# Patient Record
Sex: Male | Born: 1980 | Race: Black or African American | Hispanic: No | Marital: Married | Smoking: Current every day smoker
Health system: Southern US, Community
[De-identification: ages and names within clinical notes are randomized; demographics above are authoritative.]

## PROBLEM LIST (undated history)

## (undated) DIAGNOSIS — E7800 Pure hypercholesterolemia, unspecified: Secondary | ICD-10-CM

## (undated) DIAGNOSIS — I10 Essential (primary) hypertension: Secondary | ICD-10-CM

## (undated) DIAGNOSIS — K859 Acute pancreatitis without necrosis or infection, unspecified: Secondary | ICD-10-CM

## (undated) DIAGNOSIS — K219 Gastro-esophageal reflux disease without esophagitis: Secondary | ICD-10-CM

## (undated) DIAGNOSIS — T7840XA Allergy, unspecified, initial encounter: Secondary | ICD-10-CM

## (undated) DIAGNOSIS — G4730 Sleep apnea, unspecified: Secondary | ICD-10-CM

## (undated) HISTORY — DX: Sleep apnea, unspecified: G47.30

## (undated) HISTORY — DX: Allergy, unspecified, initial encounter: T78.40XA

## (undated) HISTORY — DX: Gastro-esophageal reflux disease without esophagitis: K21.9

---

## 2008-01-14 ENCOUNTER — Emergency Department (HOSPITAL_COMMUNITY): Admission: EM | Admit: 2008-01-14 | Discharge: 2008-01-14 | Payer: Self-pay | Admitting: Emergency Medicine

## 2008-06-13 ENCOUNTER — Emergency Department (HOSPITAL_COMMUNITY): Admission: EM | Admit: 2008-06-13 | Discharge: 2008-06-13 | Payer: Self-pay | Admitting: Emergency Medicine

## 2009-01-27 ENCOUNTER — Emergency Department (HOSPITAL_COMMUNITY): Admission: EM | Admit: 2009-01-27 | Discharge: 2009-01-28 | Payer: Self-pay | Admitting: Emergency Medicine

## 2011-08-21 ENCOUNTER — Encounter (INDEPENDENT_AMBULATORY_CARE_PROVIDER_SITE_OTHER): Payer: Self-pay | Admitting: Ophthalmology

## 2011-08-29 ENCOUNTER — Encounter (INDEPENDENT_AMBULATORY_CARE_PROVIDER_SITE_OTHER): Payer: BC Managed Care – PPO | Admitting: Ophthalmology

## 2011-08-29 DIAGNOSIS — E11319 Type 2 diabetes mellitus with unspecified diabetic retinopathy without macular edema: Secondary | ICD-10-CM

## 2011-08-29 DIAGNOSIS — I10 Essential (primary) hypertension: Secondary | ICD-10-CM

## 2011-08-29 DIAGNOSIS — E1139 Type 2 diabetes mellitus with other diabetic ophthalmic complication: Secondary | ICD-10-CM

## 2011-08-29 DIAGNOSIS — H3581 Retinal edema: Secondary | ICD-10-CM

## 2011-10-08 ENCOUNTER — Encounter (INDEPENDENT_AMBULATORY_CARE_PROVIDER_SITE_OTHER): Payer: BC Managed Care – PPO | Admitting: Ophthalmology

## 2011-11-20 ENCOUNTER — Other Ambulatory Visit: Payer: Self-pay | Admitting: Family Medicine

## 2011-11-21 ENCOUNTER — Other Ambulatory Visit: Payer: Self-pay

## 2011-11-21 NOTE — Telephone Encounter (Signed)
Please pull chart and re-route to PA pool

## 2011-11-27 ENCOUNTER — Ambulatory Visit (INDEPENDENT_AMBULATORY_CARE_PROVIDER_SITE_OTHER): Payer: PRIVATE HEALTH INSURANCE | Admitting: Family Medicine

## 2011-11-27 VITALS — BP 147/88 | HR 92 | Temp 99.0°F | Resp 16 | Ht 67.0 in | Wt 227.0 lb

## 2011-11-27 DIAGNOSIS — E785 Hyperlipidemia, unspecified: Secondary | ICD-10-CM

## 2011-11-27 DIAGNOSIS — E119 Type 2 diabetes mellitus without complications: Secondary | ICD-10-CM

## 2011-11-27 DIAGNOSIS — I10 Essential (primary) hypertension: Secondary | ICD-10-CM

## 2011-11-27 LAB — POCT GLYCOSYLATED HEMOGLOBIN (HGB A1C): Hemoglobin A1C: 9.7

## 2011-11-27 MED ORDER — PRAVASTATIN SODIUM 40 MG PO TABS: 40.0000 mg | ORAL_TABLET | Freq: Every day | ORAL | Status: DC

## 2011-11-27 MED ORDER — METFORMIN HCL ER 500 MG PO TB24: 500.0000 mg | ORAL_TABLET | Freq: Every day | ORAL | Status: DC

## 2011-11-27 MED ORDER — LISINOPRIL 10 MG PO TABS: 10.0000 mg | ORAL_TABLET | Freq: Every day | ORAL | Status: DC

## 2011-11-27 NOTE — Progress Notes (Signed)
  Subjective:    Patient ID: Lee Maldonado, male    DOB: [birth year], 31 y.o.   MRN: [id number]  HPI  Patient presents to establish care for DM.  Patient was previously was cared for by Dr. Raquel James. Ran out of medications several months ago. Here for blood work and medication refills.  Last eye exam 1 month ago; then was referred to retinal specialist.  Patient was unable to afford laser  eye procedure. No lower extremity lesions.  Review of Systems     Objective:   Physical Exam  Constitutional: He appears well-developed and well-nourished.  HENT:  Head: Normocephalic and atraumatic.  Cardiovascular: Normal rate and regular rhythm.   Pulmonary/Chest: Effort normal and breath sounds normal.  Skin:       Early neuropathic changes     Results for orders placed in visit on 11/27/11  POCT GLYCOSYLATED HEMOGLOBIN (HGB A1C)      Component Value Range   Hemoglobin A1C 9.7          Assessment & Plan:   1. DM (diabetes mellitus)  Comprehensive metabolic panel, Lipid panel, Microalbumin, urine, POCT glycosylated hemoglobin (Hb A1C)  2. Diabetic retinopathy      See medications on AVS Extensive diabetic counseling Patient to follow up with me in 6 weeks

## 2011-11-28 LAB — COMPREHENSIVE METABOLIC PANEL
Albumin: 5.4 g/dL — ABNORMAL HIGH (ref 3.5–5.2)
CO2: 25 mEq/L (ref 19–32)
Glucose, Bld: 223 mg/dL — ABNORMAL HIGH (ref 70–99)
Potassium: 4.4 mEq/L (ref 3.5–5.3)
Sodium: 139 mEq/L (ref 135–145)
Total Protein: 9 g/dL — ABNORMAL HIGH (ref 6.0–8.3)

## 2011-11-28 LAB — LIPID PANEL
Cholesterol: 196 mg/dL (ref 0–200)
LDL Cholesterol: 114 mg/dL — ABNORMAL HIGH (ref 0–99)
Triglycerides: 215 mg/dL — ABNORMAL HIGH (ref <150)

## 2011-12-02 NOTE — Progress Notes (Signed)
Quick Note:  Please call patient and review his abnormal labs with him. We started him back at his OV on all his diabetic medications. I'd like to see him back in 6 weeks. Thanks ______

## 2012-03-03 ENCOUNTER — Emergency Department (HOSPITAL_COMMUNITY): Payer: PRIVATE HEALTH INSURANCE

## 2012-03-03 ENCOUNTER — Encounter (HOSPITAL_COMMUNITY): Payer: Self-pay | Admitting: Emergency Medicine

## 2012-03-03 ENCOUNTER — Inpatient Hospital Stay (HOSPITAL_COMMUNITY)
Admission: EM | Admit: 2012-03-03 | Discharge: 2012-03-08 | DRG: 438 | Disposition: A | Discharge status: Home / Self Care | Payer: PRIVATE HEALTH INSURANCE | Attending: Family Medicine | Admitting: Family Medicine

## 2012-03-03 DIAGNOSIS — E7800 Pure hypercholesterolemia, unspecified: Secondary | ICD-10-CM | POA: Insufficient documentation

## 2012-03-03 DIAGNOSIS — J690 Pneumonitis due to inhalation of food and vomit: Secondary | ICD-10-CM | POA: Diagnosis present

## 2012-03-03 DIAGNOSIS — E785 Hyperlipidemia, unspecified: Secondary | ICD-10-CM | POA: Diagnosis present

## 2012-03-03 DIAGNOSIS — I10 Essential (primary) hypertension: Secondary | ICD-10-CM | POA: Diagnosis present

## 2012-03-03 DIAGNOSIS — K859 Acute pancreatitis without necrosis or infection, unspecified: Secondary | ICD-10-CM

## 2012-03-03 DIAGNOSIS — IMO0001 Reserved for inherently not codable concepts without codable children: Secondary | ICD-10-CM | POA: Diagnosis present

## 2012-03-03 DIAGNOSIS — F1020 Alcohol dependence, uncomplicated: Secondary | ICD-10-CM | POA: Diagnosis present

## 2012-03-03 DIAGNOSIS — F17200 Nicotine dependence, unspecified, uncomplicated: Secondary | ICD-10-CM | POA: Diagnosis present

## 2012-03-03 HISTORY — DX: Essential (primary) hypertension: I10

## 2012-03-03 HISTORY — DX: Pure hypercholesterolemia, unspecified: E78.00

## 2012-03-03 LAB — POCT I-STAT TROPONIN I: Troponin i, poc: 0 ng/mL (ref 0.00–0.08)

## 2012-03-03 NOTE — ED Notes (Signed)
Patient complaining of mid-sternal chest pain (with radiation to left side) that started around 1130 this morning.  Patient reports nausea and vomiting; one episode of vomiting within the last 24 hours. Patient denies cardiac history.  EKG done in triage.

## 2012-03-04 ENCOUNTER — Encounter (HOSPITAL_COMMUNITY): Payer: Self-pay | Admitting: Radiology

## 2012-03-04 ENCOUNTER — Inpatient Hospital Stay (HOSPITAL_COMMUNITY): Payer: PRIVATE HEALTH INSURANCE

## 2012-03-04 ENCOUNTER — Emergency Department (HOSPITAL_COMMUNITY): Payer: PRIVATE HEALTH INSURANCE

## 2012-03-04 LAB — CBC
HCT: 41 % (ref 39.0–52.0)
Hemoglobin: 13.6 g/dL (ref 13.0–17.0)
MCH: 30 pg (ref 26.0–34.0)
MCHC: 35.7 g/dL (ref 30.0–36.0)
MCV: 86.7 fL (ref 78.0–100.0)
MCV: 83.9 fL (ref 78.0–100.0)
Platelets: 359 10*3/uL (ref 150–400)
RBC: 4.73 MIL/uL (ref 4.22–5.81)
WBC: 16.5 10*3/uL — ABNORMAL HIGH (ref 4.0–10.5)

## 2012-03-04 LAB — BASIC METABOLIC PANEL
BUN: 5 mg/dL — ABNORMAL LOW (ref 6–23)
CO2: 26 mEq/L (ref 19–32)
GFR calc non Af Amer: >90 mL/min (ref >90)
Glucose, Bld: 206 mg/dL — ABNORMAL HIGH (ref 70–99)
Potassium: 3.9 mEq/L (ref 3.5–5.1)

## 2012-03-04 LAB — COMPREHENSIVE METABOLIC PANEL
Alkaline Phosphatase: 65 U/L (ref 39–117)
BUN: 10 mg/dL (ref 6–23)
CO2: 23 mEq/L (ref 19–32)
Chloride: 95 mEq/L — ABNORMAL LOW (ref 96–112)
Creatinine, Ser: 0.59 mg/dL (ref 0.50–1.35)
GFR calc Af Amer: >90 mL/min (ref >90)
GFR calc non Af Amer: >90 mL/min (ref >90)
Glucose, Bld: 272 mg/dL — ABNORMAL HIGH (ref 70–99)
Total Bilirubin: 0.3 mg/dL (ref 0.3–1.2)

## 2012-03-04 LAB — URINALYSIS, ROUTINE W REFLEX MICROSCOPIC
Glucose, UA: 500 mg/dL — AB
Leukocytes, UA: NEGATIVE
Nitrite: NEGATIVE
pH: 6 (ref 5.0–8.0)

## 2012-03-04 LAB — POCT I-STAT, CHEM 8
BUN: 9 mg/dL (ref 6–23)
Calcium, Ion: 1.15 mmol/L (ref 1.12–1.32)
Chloride: 98 mEq/L (ref 96–112)
Glucose, Bld: 264 mg/dL — ABNORMAL HIGH (ref 70–99)

## 2012-03-04 LAB — LIPID PANEL
Cholesterol: 132 mg/dL (ref 0–200)
HDL: 39 mg/dL — ABNORMAL LOW (ref >39)
Total CHOL/HDL Ratio: 3.4 RATIO
VLDL: 39 mg/dL (ref 0–40)

## 2012-03-04 LAB — LIPASE, BLOOD
Lipase: 769 U/L — ABNORMAL HIGH (ref 11–59)
Lipase: 238 U/L — ABNORMAL HIGH (ref 11–59)

## 2012-03-04 LAB — DIFFERENTIAL
Eosinophils Absolute: 0 10*3/uL (ref 0.0–0.7)
Eosinophils Relative: 0 % (ref 0–5)
Lymphocytes Relative: 10 % — ABNORMAL LOW (ref 12–46)
Lymphs Abs: 1.7 10*3/uL (ref 0.7–4.0)
Metamyelocytes Relative: 0 %
Monocytes Absolute: 0.8 10*3/uL (ref 0.1–1.0)
Monocytes Relative: 5 % (ref 3–12)
Neutro Abs: 14 10*3/uL — ABNORMAL HIGH (ref 1.7–7.7)
nRBC: 0 /100 WBC

## 2012-03-04 LAB — CREATININE, SERUM: Creatinine, Ser: 0.59 mg/dL (ref 0.50–1.35)

## 2012-03-04 MED ORDER — MORPHINE SULFATE 2 MG/ML IJ SOLN
INTRAMUSCULAR | Status: AC
  Filled 2012-03-04: qty 1

## 2012-03-04 MED ORDER — HEPARIN SODIUM (PORCINE) 5000 UNIT/ML IJ SOLN
5000.0000 [IU] | Freq: Three times a day (TID) | INTRAMUSCULAR | Status: DC
  Administered 2012-03-04 – 2012-03-08 (×13): 5000 [IU] via SUBCUTANEOUS
  Filled 2012-03-04 (×16): qty 1

## 2012-03-04 MED ORDER — ONDANSETRON HCL 4 MG PO TABS: 4.0000 mg | ORAL_TABLET | Freq: Four times a day (QID) | ORAL | Status: DC | PRN

## 2012-03-04 MED ORDER — HYDROMORPHONE HCL PF 1 MG/ML IJ SOLN
1.0000 mg | Freq: Once | INTRAMUSCULAR | Status: AC
  Administered 2012-03-04: 1 mg via INTRAVENOUS
  Filled 2012-03-04: qty 1

## 2012-03-04 MED ORDER — LORAZEPAM 2 MG/ML IJ SOLN: 1.0000 mg | Freq: Four times a day (QID) | INTRAMUSCULAR | Status: AC | PRN

## 2012-03-04 MED ORDER — OXYCODONE HCL 5 MG PO TABS
5.0000 mg | ORAL_TABLET | ORAL | Status: DC | PRN
  Administered 2012-03-04 – 2012-03-05 (×3): 5 mg via ORAL
  Filled 2012-03-04 (×5): qty 1

## 2012-03-04 MED ORDER — ONDANSETRON 4 MG PO TBDP: 4.0000 mg | ORAL_TABLET | Freq: Three times a day (TID) | ORAL | Status: AC | PRN

## 2012-03-04 MED ORDER — PNEUMOCOCCAL VAC POLYVALENT 25 MCG/0.5ML IJ INJ
0.5000 mL | INJECTION | INTRAMUSCULAR | Status: AC
  Administered 2012-03-05: 0.5 mL via INTRAMUSCULAR
  Filled 2012-03-04: qty 0.5

## 2012-03-04 MED ORDER — THIAMINE HCL 100 MG/ML IJ SOLN
100.0000 mg | Freq: Every day | INTRAMUSCULAR | Status: DC
  Filled 2012-03-04 (×5): qty 1

## 2012-03-04 MED ORDER — DIPHENHYDRAMINE HCL 50 MG/ML IJ SOLN: 12.5000 mg | Freq: Four times a day (QID) | INTRAMUSCULAR | Status: DC | PRN

## 2012-03-04 MED ORDER — ONDANSETRON HCL 4 MG/2ML IJ SOLN: 4.0000 mg | Freq: Three times a day (TID) | INTRAMUSCULAR | Status: AC | PRN

## 2012-03-04 MED ORDER — ADULT MULTIVITAMIN W/MINERALS CH
1.0000 | ORAL_TABLET | Freq: Every day | ORAL | Status: DC
  Administered 2012-03-04 – 2012-03-08 (×5): 1 via ORAL
  Filled 2012-03-04 (×5): qty 1

## 2012-03-04 MED ORDER — MORPHINE SULFATE 2 MG/ML IJ SOLN
2.0000 mg | INTRAMUSCULAR | Status: DC | PRN
  Administered 2012-03-04 – 2012-03-05 (×6): 2 mg via INTRAVENOUS
  Filled 2012-03-04 (×5): qty 1

## 2012-03-04 MED ORDER — LISINOPRIL 10 MG PO TABS
10.0000 mg | ORAL_TABLET | Freq: Every day | ORAL | Status: DC
  Administered 2012-03-04 – 2012-03-08 (×5): 10 mg via ORAL
  Filled 2012-03-04 (×5): qty 1

## 2012-03-04 MED ORDER — PROMETHAZINE HCL 25 MG PO TABS: 25.0000 mg | ORAL_TABLET | Freq: Four times a day (QID) | ORAL | Status: DC | PRN

## 2012-03-04 MED ORDER — ONDANSETRON HCL 4 MG/2ML IJ SOLN
4.0000 mg | Freq: Once | INTRAMUSCULAR | Status: AC
  Administered 2012-03-04: 4 mg via INTRAVENOUS
  Filled 2012-03-04: qty 2

## 2012-03-04 MED ORDER — HYDROMORPHONE HCL PF 1 MG/ML IJ SOLN: 1.0000 mg | INTRAMUSCULAR | Status: AC | PRN

## 2012-03-04 MED ORDER — VITAMIN B-1 100 MG PO TABS
100.0000 mg | ORAL_TABLET | Freq: Every day | ORAL | Status: DC
  Administered 2012-03-04 – 2012-03-08 (×5): 100 mg via ORAL
  Filled 2012-03-04 (×5): qty 1

## 2012-03-04 MED ORDER — OXYCODONE-ACETAMINOPHEN 5-325 MG PO TABS
2.0000 | ORAL_TABLET | Freq: Once | ORAL | Status: AC
  Administered 2012-03-04: 2 via ORAL
  Filled 2012-03-04: qty 2

## 2012-03-04 MED ORDER — FOLIC ACID 1 MG PO TABS
1.0000 mg | ORAL_TABLET | Freq: Every day | ORAL | Status: DC
  Administered 2012-03-04 – 2012-03-08 (×5): 1 mg via ORAL
  Filled 2012-03-04 (×6): qty 1

## 2012-03-04 MED ORDER — CHLORHEXIDINE GLUCONATE 0.12 % MT SOLN
15.0000 mL | Freq: Two times a day (BID) | OROMUCOSAL | Status: DC
  Administered 2012-03-04 – 2012-03-06 (×5): 15 mL via OROMUCOSAL
  Filled 2012-03-04 (×6): qty 15

## 2012-03-04 MED ORDER — SODIUM CHLORIDE 0.9 % IV SOLN: INTRAVENOUS | Status: AC

## 2012-03-04 MED ORDER — BIOTENE DRY MOUTH MT LIQD
15.0000 mL | Freq: Two times a day (BID) | OROMUCOSAL | Status: DC
  Administered 2012-03-05 – 2012-03-06 (×4): 15 mL via OROMUCOSAL

## 2012-03-04 MED ORDER — IOHEXOL 300 MG/ML  SOLN
100.0000 mL | Freq: Once | INTRAMUSCULAR | Status: AC | PRN
  Administered 2012-03-04: 100 mL via INTRAVENOUS

## 2012-03-04 MED ORDER — SODIUM CHLORIDE 0.9 % IV BOLUS (SEPSIS)
1000.0000 mL | Freq: Once | INTRAVENOUS | Status: AC
  Administered 2012-03-04: 1000 mL via INTRAVENOUS

## 2012-03-04 MED ORDER — ACETAMINOPHEN 325 MG PO TABS
650.0000 mg | ORAL_TABLET | Freq: Four times a day (QID) | ORAL | Status: DC | PRN
  Administered 2012-03-04 – 2012-03-07 (×3): 650 mg via ORAL
  Filled 2012-03-04 (×3): qty 2

## 2012-03-04 MED ORDER — IBUPROFEN 600 MG PO TABS
600.0000 mg | ORAL_TABLET | Freq: Four times a day (QID) | ORAL | Status: DC | PRN
  Administered 2012-03-04 – 2012-03-06 (×4): 600 mg via ORAL
  Filled 2012-03-04 (×4): qty 1

## 2012-03-04 MED ORDER — ONDANSETRON HCL 4 MG/2ML IJ SOLN: 4.0000 mg | Freq: Four times a day (QID) | INTRAMUSCULAR | Status: DC | PRN

## 2012-03-04 MED ORDER — METFORMIN HCL ER 500 MG PO TB24
500.0000 mg | ORAL_TABLET | Freq: Every day | ORAL | Status: DC
  Administered 2012-03-06: 500 mg via ORAL
  Filled 2012-03-04 (×6): qty 1

## 2012-03-04 MED ORDER — SODIUM CHLORIDE 0.9 % IV SOLN
INTRAVENOUS | Status: AC
  Administered 2012-03-04 – 2012-03-05 (×6): via INTRAVENOUS

## 2012-03-04 MED ORDER — OXYCODONE-ACETAMINOPHEN 5-325 MG PO TABS: 1.0000 | ORAL_TABLET | ORAL | Status: AC | PRN

## 2012-03-04 MED ORDER — SIMVASTATIN 40 MG PO TABS
40.0000 mg | ORAL_TABLET | Freq: Every day | ORAL | Status: DC
  Administered 2012-03-04 – 2012-03-07 (×4): 40 mg via ORAL
  Filled 2012-03-04 (×5): qty 1

## 2012-03-04 MED ORDER — LORAZEPAM 1 MG PO TABS: 1.0000 mg | ORAL_TABLET | Freq: Four times a day (QID) | ORAL | Status: AC | PRN

## 2012-03-04 NOTE — Progress Notes (Signed)
Called by RN as patient febrile to 101.4  S: Patient does not report any worsening of abdominal pain.  No cough, SOB, dysuria.  O:  BP 122/66  Pulse 111  Temp(Src) 101.4 F (38.6 C) (Oral)  Resp 32  Ht 5\' 8"  (1.727 m)  Wt 227 lb 6.4 oz (103.148 kg)  BMI 34.58 kg/m2  SpO2 100% Gen: covered in blankets; mildly diaphoretic Pulm: CTAB, no rales or wheezes Abd: +BS, mild epigastric pain  A/P - will check repeat CXR - will check UA - give tylenol 650mg  - will hold off on starting antibiotics given good clinical picture  BOOTH, Angelle Isais 03/04/2012, 6:05 PM

## 2012-03-04 NOTE — ED Notes (Signed)
Nurse on 5100 unable to take report.  Number given and she is to call back by 615am

## 2012-03-04 NOTE — H&P (Signed)
Family Medicine Teaching [facility] Admission History and Physical  Patient name: Lee Maldonado Medical record number: [id number] Date of birth: [birth year] Age: 31 y.o. Gender: male  Primary Care Provider: Elvina Sidle, MD, MD  Chief Complaint: abdominal pain  History of Present Illness: Lee Maldonado is a 31 y.o. year old male presenting with acute onset abdominal pain today at 11:30am that is constant and sharp. No alleviating or agrevating factors. Involves the whole belly w/ radiation to the back and epigastric region being the worst. Emesis x1 that was non bloody. Has never had anything like this happen before. Pt still has his gallbladder. Pt takes his cholesterol medications. Pts last Etoh in the early morning hours of Sunday.   Pt reports CBG at home range from 100s-200s.   Review Of Systems: Per HPI with the following additions:  Denies: CP, SOB, lightheadedness, syncope, hematochezia, melena, hematuria, hematemesis, wt loss, vision change.   Patient Active Problem List  Diagnoses  . DM (diabetes mellitus)  . HTN  . Hypercholesteremia   Past Medical History: Past Medical History  Diagnosis Date  . Diabetes mellitus   . Hypertension   . Hypercholesteremia     Past Surgical History: History reviewed. No pertinent past surgical history.  Social History: History   Social History  . Marital Status: Single    Spouse Name: N/A    Number of Children: N/A  . Years of Education: N/A   Social History Main Topics  . Smoking status: Current Everyday Smoker -- 0.2 packs/day  . Smokeless tobacco: None  . Alcohol Use: No  . Drug Use: No  . Sexually Active: None   Other Topics Concern  . None   Social History Narrative  . None    Family History: History reviewed. No pertinent family history.  Allergies: No Known Allergies  Current Facility-Administered Medications  Medication Dose Route Frequency Provider Last Rate Last Dose  . HYDROmorphone (DILAUDID)  injection 1 mg  1 mg Intravenous Once Vida Roller, MD   1 mg at 03/04/12 0130  . HYDROmorphone (DILAUDID) injection 1 mg  1 mg Intravenous Once Vida Roller, MD   1 mg at 03/04/12 0158  . iohexol (OMNIPAQUE) 300 MG/ML solution 100 mL  100 mL Intravenous Once PRN Medication Radiologist, MD   100 mL at 03/04/12 0335  . ondansetron (ZOFRAN) injection 4 mg  4 mg Intravenous Once Vida Roller, MD   4 mg at 03/04/12 0128  . oxyCODONE-acetaminophen (PERCOCET) 5-325 MG per tablet 2 tablet  2 tablet Oral Once Vida Roller, MD   2 tablet at 03/04/12 0439  . sodium chloride 0.9 % bolus 1,000 mL  1,000 mL Intravenous Once Vida Roller, MD   1,000 mL at 03/04/12 0128  . sodium chloride 0.9 % bolus 1,000 mL  1,000 mL Intravenous Once Vida Roller, MD   1,000 mL at 03/04/12 0219   Current Outpatient Prescriptions  Medication Sig Dispense Refill  . lisinopril (PRINIVIL,ZESTRIL) 10 MG tablet Take 10 mg by mouth daily.      . metFORMIN (GLUCOPHAGE-XR) 500 MG 24 hr tablet Take 500 mg by mouth daily with breakfast.      . pravastatin (PRAVACHOL) 40 MG tablet Take 40 mg by mouth daily.      . ondansetron (ZOFRAN ODT) 4 MG disintegrating tablet Take 1 tablet (4 mg total) by mouth every 8 (eight) hours as needed for nausea.  10 tablet  0  . oxyCODONE-acetaminophen (PERCOCET) 5-325 MG per tablet  Take 1 tablet by mouth every 4 (four) hours as needed for pain. May take 2 tablets PO q 6 hours for severe pain - Do not take with Tylenol as this tablet already contains tylenol  30 tablet  0  . promethazine (PHENERGAN) 25 MG tablet Take 1 tablet (25 mg total) by mouth every 6 (six) hours as needed for nausea.  12 tablet  0     Physical Exam: Filed Vitals:   03/04/12 0300  BP: 141/86  Pulse: 98  Temp:   Resp: 20   General: alert, cooperative and mild distress HEENT: extra ocular movement intact Heart: S1, S2 normal, no murmur, rub or gallop, regular rate and rhythm Lungs: clear to auscultation, no wheezes  or rales and unlabored breathing on 2L [place] Abdomen: Mild diffuse abdominal pain. Worse in epigastric region. Hypoactive bowel sounds Extremities: 2+ distal pulses, no edema Musculoskeletal: normal ROM Skin:no rashes, no ecchymoses Neurology: normal without focal findings and mental status, speech normal, alert and oriented x3  Labs and Imaging: Lab Results  Component Value Date/Time   NA 136 03/04/2012  4:27 AM   K 4.7 03/04/2012  4:27 AM   CL 98 03/04/2012  4:27 AM   CO2 23 03/03/2012 11:40 PM   BUN 9 03/04/2012  4:27 AM   CREATININE 0.70 03/04/2012  4:27 AM   CREATININE 0.89 11/27/2011  7:47 PM   GLUCOSE 264* 03/04/2012  4:27 AM   Lab Results  Component Value Date   WBC 16.5* 03/03/2012   HGB 14.3 03/04/2012   HCT 42.0 03/04/2012   MCV 86.7 03/03/2012   PLT 359 03/03/2012   Lipase     Component Value Date/Time   LIPASE 769* 03/04/2012 0105    Dg Chest 2 View  03/03/2012  *RADIOLOGY REPORT*  Clinical Data: Chest pain.  History of hypertension.  CHEST - 2 VIEW  Comparison: No priors.  Findings:   Lung volumes are normal.  No consolidative airspace disease.  No pleural effusions.  The mild diffuse interstitial prominence and peribronchial cuffing is noted.  Pulmonary vasculature is normal.  Heart size is normal.  Mediastinal contours are unremarkable.  IMPRESSION: 1.  Findings, as above, suggestive of mild acute bronchitis.  Original Report Authenticated By: Florencia Reasons, M.D.    CT ABDOMEN AND PELVIS WITH CONTRAST    1.  CT findings consistent with acute pancreatitis involving the tail region of the pancreas without complicating features such as hemorrhage or necrosis.  Moderate fluid in the anterior pararenal space on the left. 2.  Diffuse fatty infiltration of the liver. 3.  Normal CT appearance of the gallbladder and normal caliber common bile duct.  Original Report Authenticated By: P. Loralie Champagne, M.D.   Troponin (Point of Care Test)  [facility] 03/03/12 2240  TROPIPOC 0.00   CXR: 1.  Findings, as above, suggestive of mild acute bronchitis.  Assessment and Plan: Lee Maldonado is a 31 y.o. year old male with h/o HLD, DM, HTN, presenting w/ acute onset abdominal pain w/ laboratory and imaging confirmed pancreatitis.  1. Pancreatitis: Etiology likely Etoh induced vs hypertriglyceridemia. Last alcoholic drink less than 36 hrs prior to onset of symptoms. Last lipid panel in February. Lipase 769, w/ CT imaging significant for pancreatitis in the tail of the pancreas. No sign of biliary stone involvement - Admit to inpt - NPO - IVF NS 133ml/hr - IV morphine 2mg  Q3 PRN - Oxycodone 5mg  Q4 - BMET and Lipase at 1700 - repeat Lipid panel at  1700 - Benadryl for itching - Zofran for nausea  2. DM: Last A1c >9. CBG on admission 264. Only taking Metformin. Will likely need insulin. Will wait to start until PO returns.  - Repeat A1c  3. FEN/GI: Nauseaus w/ emesis x1 at home. HyperK on first labs likely from hemolysis as repeat labs normal. - NPO and IVF as above  4.  Prophylaxis:  - Protonix - Heparin SQ TID  5. Disposition: Pending clinical improvement    Signed: MERRELL, DAVID, M.D. Family Medicine Resident PGY-1 [phone] 03/04/2012 5:57 AM   R3 Addendum Edd Arbour, MD  Patient seen and examined by me independently. I have reviewed the above note and agree with the A/P. Independent PE below:  Filed Vitals:   03/04/12 0500 03/04/12 0600 03/04/12 0602 03/04/12 0648  BP: 132/79 155/91  142/80  Pulse: 112 101  103  Temp:   99.1 F (37.3 C) 98.7 F (37.1 C)  TempSrc:   Oral Oral  Resp:    21  Height:    5\' 8"  (1.727 m)  Weight:    227 lb 6.4 oz (103.148 kg)  SpO2: 98% 96%  96%   General: aam, nad, sleeping, alert to voice. aox3 Lungs:  Normal respiratory effort, chest expands symmetrically. Lungs are clear to auscultation, no crackles or wheezes. Heart - Regular rate and rhythm.  No murmurs, gallops or rubs.    Abd: diffusely tender to palpation,  distended but soft, no rebound or rigidity.  Extremities:   Non-tender, No cyanosis, edema, or deformity noted. Skin:  Intact without suspicious lesions or rashes  Edd Arbour, MD

## 2012-03-04 NOTE — H&P (Signed)
I have seen and examined this patient. I have discussed with Dr(s) Konrad Dolores and Rivka Safer .  I agree with their findings and plans as documented in their admission notes.  Acute Issues 1. Acute Alcohol-induced pancreatitis. - Mild to moderate grade. - Adequate pain control with opiates.  - IVF at maintenance.  - Advance diet as tolerated. - Counseling about alcohol abuse and dependence.

## 2012-03-04 NOTE — Progress Notes (Signed)
Inpatient Diabetes Program Recommendations  AACE/ADA: New Consensus Statement on Inpatient Glycemic Control  Target Ranges:  Prepandial:   less than 140 mg/dL      Peak postprandial:   less than 180 mg/dL (1-2 hours)      Critically ill patients:  140 - 180 mg/dL  Pager:  [phone] Hours:  8 am-10pm   Reason for Visit: Elevated glucose: 264 mg/dL  Inpatient Diabetes Program Recommendations Correction (SSI): Add Novolog Correction HgbA1C: Consider adjusting home medications due to elevated HGBA1C Outpatient Referral: Please order outpatient diabetes education  Alfredia Client PhD, RN Diabetes Coordinator  Office:  [phone] Team Pager:  [phone]

## 2012-03-04 NOTE — ED Notes (Signed)
CT called contrast completed.

## 2012-03-04 NOTE — ED Notes (Signed)
Started drinking contrast.

## 2012-03-04 NOTE — ED Provider Notes (Signed)
History     CSN: [id number]  Arrival date & time 03/03/12  2202   First MD Initiated Contact with Patient 03/04/12 0046      Chief Complaint  Patient presents with  . Chest Pain    (Consider location/radiation/quality/duration/timing/severity/associated sxs/prior treatment) HPI Comments: 31 year old male with a history of hypertension and diabetes who presents with a complaint of left-sided abdominal pain. According to the patient this started approximately 11:30 in the morning, has been persistent, gradually worsening and now radiating up into the epigastrium. He states this is a sharp and stabbing pain, associated with one episode of nausea and vomiting but no changes in bowel habits including diarrhea constipation blood in the stools or difficulty urinating. He has had hardly anything deeper drink today and has a hard time making any urine. He denies swelling, rash, headache, sore throat, blurred vision, fevers, chills, coughing. He has had no medication prior to arrival. Symptoms are persistent, moderate to severe at this time  Patient is a 31 y.o. male presenting with chest pain. The history is provided by the patient.  Chest Pain     Past Medical History  Diagnosis Date  . Diabetes mellitus   . Hypertension   . Hypercholesteremia     History reviewed. No pertinent past surgical history.  History reviewed. No pertinent family history.  History  Substance Use Topics  . Smoking status: Current Everyday Smoker -- 0.2 packs/day  . Smokeless tobacco: Not on file  . Alcohol Use: No      Review of Systems  Cardiovascular: Positive for chest pain.  All other systems reviewed and are negative.    Allergies  Review of patient's allergies indicates no known allergies.  Home Medications   Current Outpatient Rx  Name Route Sig Dispense Refill  . LISINOPRIL 10 MG PO TABS Oral Take 10 mg by mouth daily.    Marland Kitchen METFORMIN HCL ER 500 MG PO TB24 Oral Take 500 mg by mouth daily  with breakfast.    . PRAVASTATIN SODIUM 40 MG PO TABS Oral Take 40 mg by mouth daily.    Marland Kitchen ONDANSETRON 4 MG PO TBDP Oral Take 1 tablet (4 mg total) by mouth every 8 (eight) hours as needed for nausea. 10 tablet 0  . OXYCODONE-ACETAMINOPHEN 5-325 MG PO TABS Oral Take 1 tablet by mouth every 4 (four) hours as needed for pain. May take 2 tablets PO q 6 hours for severe pain - Do not take with Tylenol as this tablet already contains tylenol 30 tablet 0  . PROMETHAZINE HCL 25 MG PO TABS Oral Take 1 tablet (25 mg total) by mouth every 6 (six) hours as needed for nausea. 12 tablet 0    BP 141/86  Pulse 98  Temp(Src) 99 F (37.2 C) (Oral)  Resp 20  SpO2 97%  Physical Exam  Nursing note and vitals reviewed. Constitutional: He appears well-developed and well-nourished.       Uncomfortable appearing  HENT:  Head: Normocephalic and atraumatic.  Mouth/Throat: Oropharynx is clear and moist. No oropharyngeal exudate.  Eyes: Conjunctivae and EOM are normal. Pupils are equal, round, and reactive to light. Right eye exhibits no discharge. Left eye exhibits no discharge. No scleral icterus.  Neck: Normal range of motion. Neck supple. No JVD present. No thyromegaly present.  Cardiovascular: Normal rate, regular rhythm, normal heart sounds and intact distal pulses.  Exam reveals no gallop and no friction rub.   No murmur heard. Pulmonary/Chest: Effort normal and breath sounds normal. No  respiratory distress. He has no wheezes. He has no rales.  Abdominal: Soft. Bowel sounds are normal. He exhibits no distension and no mass. There is tenderness ( Focal left lower quadrant tenderness with guarding, non-peritoneal, no pain in the right lower quadrant, minimal pain in the right upper quadrant and epigastrium.). There is guarding. There is no rebound.  Musculoskeletal: Normal range of motion. He exhibits no edema and no tenderness.  Lymphadenopathy:    He has no cervical adenopathy.  Neurological: He is alert.  Coordination normal.  Skin: Skin is warm and dry. No rash noted. No erythema.  Psychiatric: He has a normal mood and affect. His behavior is normal.    ED Course  Procedures (including critical care time)  ED ECG REPORT   Date: 03/04/2012   Rate: 101  Rhythm: sinus tachycardia  QRS Axis: normal  Intervals: normal  ST/T Wave abnormalities: normal  Conduction Disutrbances:none  Narrative Interpretation:   Old EKG Reviewed: none available   Labs Reviewed  CBC - Abnormal; Notable for the following:    WBC 16.5 (*)    RDW 17.0 (*)    All other components within normal limits  DIFFERENTIAL - Abnormal; Notable for the following:    Neutrophils Relative 84 (*)    Lymphocytes Relative 10 (*)    Neutro Abs 14.0 (*)    All other components within normal limits  COMPREHENSIVE METABOLIC PANEL - Abnormal; Notable for the following:    Sodium 133 (*) POST-ULTRACENTRIFUGATION   Potassium 6.1 (*) HEMOLYSIS AT THIS LEVEL MAY AFFECT RESULT   Chloride 95 (*)    Glucose, Bld 272 (*)    Total Protein 8.7 (*)    AST 47 (*) HEMOLYSIS AT THIS LEVEL MAY AFFECT RESULT   All other components within normal limits  LIPASE, BLOOD - Abnormal; Notable for the following:    Lipase 769 (*)    All other components within normal limits  POCT I-STAT TROPONIN I   Dg Chest 2 View  03/03/2012  *RADIOLOGY REPORT*  Clinical Data: Chest pain.  History of hypertension.  CHEST - 2 VIEW  Comparison: No priors.  Findings:   Lung volumes are normal.  No consolidative airspace disease.  No pleural effusions.  The mild diffuse interstitial prominence and peribronchial cuffing is noted.  Pulmonary vasculature is normal.  Heart size is normal.  Mediastinal contours are unremarkable.  IMPRESSION: 1.  Findings, as above, suggestive of mild acute bronchitis.  Original Report Authenticated By: Florencia Reasons, M.D.   Ct Abdomen Pelvis W Contrast  03/04/2012  *RADIOLOGY REPORT*  Clinical Data: Epigastric abdominal pain.  CT  ABDOMEN AND PELVIS WITH CONTRAST  Technique:  Multidetector CT imaging of the abdomen and pelvis was performed following the standard protocol during bolus administration of intravenous contrast.  Contrast: OMNIPAQUE IOHEXOL 300 MG/ML  SOLN  Comparison: None.  Findings: The lung bases demonstrate bibasilar atelectasis.  No pleural effusion.  There is diffuse fatty infiltration of the liver but no focal hepatic lesions or intrahepatic biliary dilatation to the gallbladder is normal.  No common bile duct dilatation.  The spleen is normal in size.  There is inflammation involving the tail region of the pancreas with surrounding inflammatory phlegmon.  Findings consistent with acute pancreatitis.  The head and body are not involved.  Normal caliber common bile duct and pancreatic duct.  No findings for pancreatic necrosis or hemorrhage.  The adrenal glands and kidneys are normal.  The stomach, duodenum, small bowel and colon  are unremarkable. A small bowel lipoma is noted.  Small scattered mesenteric and retroperitoneal lymph nodes but no adenopathy.  The aorta is normal in caliber.  The major branch vessels are normal.  The bladder is mildly distended.  The prostate gland and seminal vesicles are unremarkable.  No pelvic mass or adenopathy.  The bony structures are unremarkable.  IMPRESSION:  1.  CT findings consistent with acute pancreatitis involving the tail region of the pancreas without complicating features such as hemorrhage or necrosis.  Moderate fluid in the anterior pararenal space on the left. 2.  Diffuse fatty infiltration of the liver. 3.  Normal CT appearance of the gallbladder and normal caliber common bile duct.  Original Report Authenticated By: P. Loralie Champagne, M.D.     1. Acute pancreatitis       MDM  Lungs and heart are clear, pulse is 98 on my exam, normal blood pressure, afebrile, normal oxygen saturations. EKG shows normal sinus rhythm without any acute findings and there is no  old EKG to compare to.  Initial laboratory data suggests a white blood cell count of 16,500 with a leftward shift, hyperkalemia at 6.1, hyperglycemia at 272 and a mild elevation in AST of 47. Troponin is negative, lipase was added, CT scan to rule out diverticulitis, perforation.  Lipase elevated, LFT's normal, CT shows pancreatitis, no findings on LLQ.   Patient reexamined with minimally tender abdomen, feels better, pain has improved significantly and vital signs are normal including a pulse less than 100.  Patient reexamined and still has ongoing epigastric pain. He has been given several doses of pain medication and does not feel comfortable going home. His pulse is currently 95, oxygen levels are normal, has ongoing nausea and pain.   Discussed with family practice resident, will admit to the hospital.  Vida Roller, MD 03/04/12 [phone]

## 2012-03-04 NOTE — ED Notes (Signed)
Patient states he has been having pain around his abd to the lower back since about 1130 this AM. +vomiting X1 No cardiac history

## 2012-03-05 LAB — GLUCOSE, CAPILLARY
Glucose-Capillary: 202 mg/dL — ABNORMAL HIGH (ref 70–99)
Glucose-Capillary: 193 mg/dL — ABNORMAL HIGH (ref 70–99)

## 2012-03-05 LAB — HEMOGLOBIN A1C: Hgb A1c MFr Bld: 11.1 % — ABNORMAL HIGH (ref <5.7)

## 2012-03-05 MED ORDER — INSULIN ASPART 100 UNIT/ML ~~LOC~~ SOLN: 0.0000 [IU] | Freq: Every day | SUBCUTANEOUS | Status: DC

## 2012-03-05 MED ORDER — OXYCODONE-ACETAMINOPHEN 5-325 MG PO TABS
1.0000 | ORAL_TABLET | ORAL | Status: DC | PRN
  Administered 2012-03-05: 1 via ORAL
  Administered 2012-03-05 (×2): 2 via ORAL
  Administered 2012-03-05: 1 via ORAL
  Administered 2012-03-06 – 2012-03-08 (×9): 2 via ORAL
  Filled 2012-03-05 (×4): qty 2
  Filled 2012-03-05: qty 1
  Filled 2012-03-05 (×7): qty 2
  Filled 2012-03-05: qty 1

## 2012-03-05 MED ORDER — AZITHROMYCIN 500 MG PO TABS
500.0000 mg | ORAL_TABLET | Freq: Every day | ORAL | Status: DC
  Filled 2012-03-05: qty 1

## 2012-03-05 MED ORDER — AMOXICILLIN-POT CLAVULANATE 875-125 MG PO TABS
1.0000 | ORAL_TABLET | Freq: Two times a day (BID) | ORAL | Status: DC
  Administered 2012-03-05 – 2012-03-08 (×7): 1 via ORAL
  Filled 2012-03-05 (×8): qty 1

## 2012-03-05 MED ORDER — MORPHINE SULFATE 2 MG/ML IJ SOLN
2.0000 mg | Freq: Once | INTRAMUSCULAR | Status: AC
  Administered 2012-03-05: 2 mg via INTRAVENOUS

## 2012-03-05 MED ORDER — INSULIN ASPART 100 UNIT/ML ~~LOC~~ SOLN
0.0000 [IU] | Freq: Three times a day (TID) | SUBCUTANEOUS | Status: DC
  Administered 2012-03-05 – 2012-03-06 (×3): 3 [IU] via SUBCUTANEOUS
  Administered 2012-03-06 (×2): 2 [IU] via SUBCUTANEOUS
  Administered 2012-03-07: 3 [IU] via SUBCUTANEOUS
  Administered 2012-03-07 – 2012-03-08 (×3): 2 [IU] via SUBCUTANEOUS
  Administered 2012-03-08: 3 [IU] via SUBCUTANEOUS

## 2012-03-05 MED ORDER — MORPHINE SULFATE 2 MG/ML IJ SOLN
INTRAMUSCULAR | Status: AC
  Filled 2012-03-05: qty 1

## 2012-03-05 NOTE — Progress Notes (Signed)
I discussed with  Dr Merrell.  I agree with their plans documented in their progress note for today.  

## 2012-03-05 NOTE — Clinical Social Work Psychosocial (Signed)
     Clinical Social Work Department BRIEF PSYCHOSOCIAL ASSESSMENT 03/05/2012  Patient:  Lee Maldonado, Lee Maldonado     Account Number:  [id number]     Admit date:  03/03/2012  Clinical Social Worker:  Lourdes Sledge  Date/Time:  03/05/2012 01:33 PM  Referred by:  Physician  Date Referred:  03/05/2012 Referred for  Substance Abuse   Other Referral:   Interview type:  Patient Other interview type:    PSYCHOSOCIAL DATA Living Status:  FAMILY Admitted from facility:   Level of care:   Primary support name:  Abel Presto Primary support relationship to patient:  SPOUSE Degree of support available:   Pt did not disclose amount of support avilable.    CURRENT CONCERNS Current Concerns  Substance Abuse   Other Concerns:    SOCIAL WORK ASSESSMENT / PLAN CSW received refferal for substance abuse    CSW introcuded herself and role. CSW informed pt of referral. Pt stated he does not have any issues with alcohol and only drinks casually. Pt states he drinks "sometimes on weekends." Pt stated he has never had an alchol problem and is not interested in resources. Pt appreciated visit however felt it was inappropriate referral.   Assessment/plan status:   Other assessment/ plan:   Information/referral to community resources:   Pt declined any substance abuse resources. CSW did provide pt with her contact information if he did have additional cSW needs while in the hospital.    PATIENTS/FAMILYS RESPONSE TO PLAN OF CARE: Pt laying in bed getting ready to eat an [place] ice.Pt was alert and oriented and pleasant to speak to. Pt was open to answering CSW questions pertaining to substance abuse however stated he did not have an alcohol problem and was not interested in substance abuse resources.

## 2012-03-05 NOTE — Progress Notes (Signed)
Family Medicine Teaching Service [facility] Progress Note  Patient name: Lee Maldonado Medical record number: [id number] Date of birth: 03-23-1981 Age: 31 y.o. Gender: male    LOS: 2 days   Primary Care Provider: Elvina Sidle, MD, MD  Overnight Events:  Pain improved this am, but still feels poorly. Feels better on O2. Wants to ambulate. Tolerating PO. Some pain w/ initial swallowing of jello which only lasted minutes. No lasting pain and no recurrance of pain w/ meals   Objective: Vital signs in last 24 hours: Temp:  [99.5 F (37.5 C)-101.8 F (38.8 C)] 99.5 F (37.5 C) (06/05 0059) Pulse Rate:  [100-111] 109  (06/05 0545) Resp:  [18-32] 26  (06/05 0059) BP: (109-141)/(51-88) 118/68 mmHg (06/05 0545) SpO2:  [93 %-100 %] 96 % (06/05 0059)  Wt Readings from Last 3 Encounters:  03/04/12 227 lb 6.4 oz (103.148 kg)  11/27/11 227 lb (102.967 kg)     Current Facility-Administered Medications  Medication Dose Route Frequency Provider Last Rate Last Dose  . 0.9 %  sodium chloride infusion   Intravenous STAT Vida Roller, MD 150 mL/hr at 03/04/12 0700    . 0.9 %  sodium chloride infusion   Intravenous Continuous Ozella Rocks, MD 150 mL/hr at 03/05/12 0732    . acetaminophen (TYLENOL) tablet 650 mg  650 mg Oral Q6H PRN Phebe Colla, MD   650 mg at 03/04/12 1817  . antiseptic oral rinse (BIOTENE) solution 15 mL  15 mL Mouth Rinse q12n4p Todd D McDiarmid, MD      . azithromycin (ZITHROMAX) tablet 500 mg  500 mg Oral Daily Ozella Rocks, MD      . chlorhexidine (PERIDEX) 0.12 % solution 15 mL  15 mL Mouth Rinse BID Leighton Roach McDiarmid, MD   15 mL at 03/05/12 0746  . diphenhydrAMINE (BENADRYL) injection 12.5 mg  12.5 mg Intravenous Q6H PRN Ozella Rocks, MD      . folic acid (FOLVITE) tablet 1 mg  1 mg Oral Daily Phebe Colla, MD   1 mg at 03/04/12 1227  . heparin injection 5,000 Units  5,000 Units Subcutaneous Q8H Ozella Rocks, MD   5,000 Units at 03/05/12 0543  . HYDROmorphone  (DILAUDID) injection 1 mg  1 mg Intravenous Q4H PRN Vida Roller, MD      . ibuprofen (ADVIL,MOTRIN) tablet 600 mg  600 mg Oral Q6H PRN Ozella Rocks, MD   600 mg at 03/04/12 2245  . lisinopril (PRINIVIL,ZESTRIL) tablet 10 mg  10 mg Oral Daily Ozella Rocks, MD   10 mg at 03/04/12 1042  . LORazepam (ATIVAN) tablet 1 mg  1 mg Oral Q6H PRN Phebe Colla, MD       Or  . LORazepam (ATIVAN) injection 1 mg  1 mg Intravenous Q6H PRN Phebe Colla, MD      . metFORMIN (GLUCOPHAGE-XR) 24 hr tablet 500 mg  500 mg Oral Q breakfast Ozella Rocks, MD      . morphine 2 MG/ML injection 2 mg  2 mg Intravenous Q3H PRN Ozella Rocks, MD   2 mg at 03/05/12 0548  . morphine 2 MG/ML injection           . mulitivitamin with minerals tablet 1 tablet  1 tablet Oral Daily Phebe Colla, MD   1 tablet at 03/04/12 1228  . ondansetron (ZOFRAN) injection 4 mg  4 mg Intravenous Q8H PRN Vida Roller, MD      .  ondansetron (ZOFRAN) tablet 4 mg  4 mg Oral Q6H PRN Ozella Rocks, MD       Or  . ondansetron [facility]) injection 4 mg  4 mg Intravenous Q6H PRN Ozella Rocks, MD      . oxyCODONE (Oxy IR/ROXICODONE) immediate release tablet 5 mg  5 mg Oral Q4H PRN Ozella Rocks, MD   5 mg at 03/05/12 0746  . pneumococcal 23 valent vaccine (PNU-IMMUNE) injection 0.5 mL  0.5 mL Intramuscular Tomorrow-1000 Todd D McDiarmid, MD      . simvastatin (ZOCOR) tablet 40 mg  40 mg Oral q1800 Ozella Rocks, MD   40 mg at 03/04/12 1817  . thiamine (VITAMIN B-1) tablet 100 mg  100 mg Oral Daily Phebe Colla, MD   100 mg at 03/04/12 1227   Or  . thiamine (B-1) injection 100 mg  100 mg Intravenous Daily Phebe Colla, MD         PE: Gen: NAD HEENT: MMM CV: RRR, no m/r/g [id number] breath sounds on Rt. Poor air movement. Normal effort. On 2L [place]. No crackles Abd: soft non-tender Ext/Musc: 2+ pulses Neuro: CN intact  Labs/Studies: chest X-ray: Patchy right lower lobe airspace disease worrisome for  pneumonia.   Assessment/Plan: Lee Maldonado is a 31 y.o. year old male with h/o HLD, DM, HTN, presenting w/ acute onset abdominal pain w/ laboratory and imaging confirmed pancreatitis, now w/ Pneumonia  # Pancreatitis: Etiology likely Etoh induced vs hypertriglyceridemia. Last alcoholic drink less than 36 hrs prior to onset of symptoms. Last lipid panel in February. Lipase 769 on admission now 238 w/ CT imaging significant for pancreatitis in the tail of the pancreas. No sign of biliary stone involvement. Now tolerating clears.  - ADAT - IVF NS 176ml/hr (Will DC tonight at 1700 as po returns) - Will change pain regimen to Percocet only  - Benadryl for itching  - Zofran for nausea  - BMET in am  # Pneumonia: Pt w/ likely community acquired pneumonia. No h/o aspiration or emesis w/ drinking binge this weekend. Fever overnight w/ CXR changes - Augmentin 875 BID  # DM: A1c increased to 11.1 from previous of 9. Pt very poorly controlled. . CBG on admission 264 now 206. Only taking Metformin. Will likely need to increase metformin and start insulin.  -Will start SSI - cont Metformin   # FEN/GI: tolerating clears. - ADAT - IVF as above   # Prophylaxis:  - Protonix  - Heparin SQ TID    # Disposition: Pending clinical improvement  LOS 2  Signed: Shelly Flatten, MD Family Medicine Resident PGY-1 [phone] 03/05/2012 9:12 AM

## 2012-03-06 ENCOUNTER — Inpatient Hospital Stay (HOSPITAL_COMMUNITY): Payer: PRIVATE HEALTH INSURANCE

## 2012-03-06 LAB — GLUCOSE, CAPILLARY
Glucose-Capillary: 210 mg/dL — ABNORMAL HIGH (ref 70–99)
Glucose-Capillary: 187 mg/dL — ABNORMAL HIGH (ref 70–99)
Glucose-Capillary: 151 mg/dL — ABNORMAL HIGH (ref 70–99)

## 2012-03-06 LAB — BASIC METABOLIC PANEL
CO2: 26 mEq/L (ref 19–32)
Calcium: 8.8 mg/dL (ref 8.4–10.5)
Chloride: 97 mEq/L (ref 96–112)
Glucose, Bld: 174 mg/dL — ABNORMAL HIGH (ref 70–99)
Sodium: 132 mEq/L — ABNORMAL LOW (ref 135–145)

## 2012-03-06 MED ORDER — IOHEXOL 300 MG/ML  SOLN
20.0000 mL | INTRAMUSCULAR | Status: AC
  Administered 2012-03-06 (×2): 20 mL via ORAL

## 2012-03-06 MED ORDER — BD GETTING STARTED TAKE HOME KIT: 1/2ML X 30G SYRINGES
1.0000 | Freq: Once | Status: DC
  Filled 2012-03-06: qty 1

## 2012-03-06 MED ORDER — MORPHINE SULFATE 2 MG/ML IJ SOLN
2.0000 mg | Freq: Once | INTRAMUSCULAR | Status: AC
  Administered 2012-03-06: 2 mg via INTRAVENOUS
  Filled 2012-03-06: qty 1

## 2012-03-06 MED ORDER — LIVING WELL WITH DIABETES BOOK
Freq: Once | Status: DC
  Filled 2012-03-06: qty 1

## 2012-03-06 MED ORDER — MORPHINE SULFATE 4 MG/ML IJ SOLN
4.0000 mg | INTRAMUSCULAR | Status: DC | PRN
  Administered 2012-03-06 – 2012-03-07 (×4): 4 mg via INTRAVENOUS
  Filled 2012-03-06 (×5): qty 1

## 2012-03-06 MED ORDER — BD GETTING STARTED TAKE HOME KIT: 3/10ML X 30G SYRINGES
1.0000 | Freq: Once | Status: AC
  Administered 2012-03-06: 1
  Filled 2012-03-06: qty 1

## 2012-03-06 MED ORDER — MORPHINE SULFATE 2 MG/ML IJ SOLN
2.0000 mg | INTRAMUSCULAR | Status: DC | PRN
  Administered 2012-03-06 (×2): 2 mg via INTRAVENOUS
  Filled 2012-03-06 (×2): qty 1

## 2012-03-06 NOTE — Progress Notes (Addendum)
Pt's temp - 101.6, BP - 156/91, P - 109.  Pt also states he does not feel like he can tolerate the fat modified diet.  650mg  PO Tylenol given and diet decreased back to clear liquids.  MD notified.  Will continue to monitor.   Lee Maldonado

## 2012-03-06 NOTE — Progress Notes (Signed)
I have seen and examined this patient. I have discussed with Dr Konrad Dolores.  I agree with their findings and plans as documented in their progress note for today. Acute Issues 1. Acute Alcohol-induced Pancreatitis - Slowly improving. Still in pain > 5 out of 10, especially after moving. - Tolerating clear diet only. Still on IV fluid,. - Plan to increase Morphine to 4 mg q 4 hours as needed to see if possible to get pain control, particularly with movement.

## 2012-03-06 NOTE — Progress Notes (Addendum)
Pt with fever after being afebrile yesterday.  Needed to be put back on o2.  abd pain about the same.  Mentation is intact and patient is interacting normally.  Does not appear sick in bed.  Food does not appear to be making abd pain worse.  Abd is diffusely tender with slightly more tenderness in the upper quadrents.  At this time ddx include new UTI, worsening PNA, infection of pancreas.  Will obtain ua, cxr, and monitor.  If fevers persist or abd pain worsens will plan to re-scan abdomen.  Family updated.  Lee Maldonado 03/06/2012, 7:56 PM  After further review of the patient's chart and review of his cxr, I will be proceeding with ct abd/pelvis.  Patient does appear to have worsening abdominal pain (he is significantly more tender for me on exam than is documented on previous exams) with a new fever.  He also has not been tolerating food very well.  These things together make me more concerned for the possibility of pancreatic necrosis and infection.  If such were to develop, the antibiotics he is on would not cover his infection.  CXR does show interval worsening of PNA (although this is not unexpected given the x-ray findings often lag clinical picture) but I do not think this can completely explain his new fever as he was afebrile throughout the day.  Lee Maldonado 03/06/2012, 9:08 PM

## 2012-03-06 NOTE — Progress Notes (Signed)
Pt's temp elevated to 102.2 and P - 120.  600mg  PO Ibuprofen given and MD notified.  Will continue to monitor.  Lee Maldonado [place]

## 2012-03-06 NOTE — Progress Notes (Signed)
UR complete 

## 2012-03-06 NOTE — Progress Notes (Signed)
Inpatient Diabetes Program Recommendations  AACE/ADA: New Consensus Statement on Inpatient Glycemic Control (2009)  Target Ranges:  Prepandial:   less than 140 mg/dL      Peak postprandial:   less than 180 mg/dL (1-2 hours)      Critically ill patients:  140 - 180 mg/dL   Reason for Visit: conslult to "start insulin".  Made recommendation for Lantus dosing.   Will order insulin starter kit for bedside RN's to begin teaching as well as TB ed'l network videos (if requesting home insulin). Will also order RD consult. Recommend OP education as well.  Inpatient Diabetes Program Recommendations Insulin - Basal: Add Lantus 20 units (starting dose of 0.2 units/kg).  If to send home on insulin, order insulin administtration instruction per bedside RN with insulin starter kit. Correction (SSI): Add Novolog Correction HgbA1C: Consider adjusting home medications due to elevated HGBA1C Outpatient Referral: Please order outpatient diabetes education  Note: Thank you, Lenor Coffin, RN, CNS, Diabetes Coordinator [phone])

## 2012-03-06 NOTE — Progress Notes (Signed)
Family Medicine Teaching Service [facility] Progress Note  Patient name: Lee Maldonado Medical record number: [id number] Date of birth: Jan 05, 1981 Age: 31 y.o. Gender: male    LOS: 3 days   Primary Care Provider: Elvina Sidle, MD, MD  Overnight Events:  NAEO, some increased pain w/ ambulation and low appetite. Required IV morphine x1. Some SOB w/ ambulation requirirng O2   Objective: Vital signs in last 24 hours: Temp:  [97.9 F (36.6 C)-98.8 F (37.1 C)] 98.7 F (37.1 C) (06/06 0602) Pulse Rate:  [95-116] 98  (06/06 0602) Resp:  [18-20] 18  (06/06 0602) BP: (124-150)/(54-87) 139/78 mmHg (06/06 0602) SpO2:  [93 %-97 %] 95 % (06/06 0602)  Wt Readings from Last 3 Encounters:  03/04/12 227 lb 6.4 oz (103.148 kg)  11/27/11 227 lb (102.967 kg)     Current Facility-Administered Medications  Medication Dose Route Frequency Provider Last Rate Last Dose  . 0.9 %  sodium chloride infusion   Intravenous Continuous Ozella Rocks, MD 150 mL/hr at 03/05/12 1449    . acetaminophen (TYLENOL) tablet 650 mg  650 mg Oral Q6H PRN Phebe Colla, MD   650 mg at 03/04/12 1817  . amoxicillin-clavulanate (AUGMENTIN) 875-125 MG per tablet 1 tablet  1 tablet Oral Q12H Shelva Majestic, MD   1 tablet at 03/05/12 2124  . antiseptic oral rinse (BIOTENE) solution 15 mL  15 mL Mouth Rinse q12n4p Leighton Roach McDiarmid, MD   15 mL at 03/05/12 1600  . chlorhexidine (PERIDEX) 0.12 % solution 15 mL  15 mL Mouth Rinse BID Leighton Roach McDiarmid, MD   15 mL at 03/06/12 0815  . diphenhydrAMINE (BENADRYL) injection 12.5 mg  12.5 mg Intravenous Q6H PRN Ozella Rocks, MD      . folic acid (FOLVITE) tablet 1 mg  1 mg Oral Daily Phebe Colla, MD   1 mg at 03/05/12 1150  . heparin injection 5,000 Units  5,000 Units Subcutaneous Q8H Ozella Rocks, MD   5,000 Units at 03/06/12 [id number]  . ibuprofen (ADVIL,MOTRIN) tablet 600 mg  600 mg Oral Q6H PRN Ozella Rocks, MD   600 mg at 03/05/12 1752  . insulin aspart (novoLOG) injection 0-5  Units  0-5 Units Subcutaneous QHS Ozella Rocks, MD      . insulin aspart (novoLOG) injection 0-9 Units  0-9 Units Subcutaneous TID WC Ozella Rocks, MD   3 Units at 03/06/12 0815  . lisinopril (PRINIVIL,ZESTRIL) tablet 10 mg  10 mg Oral Daily Ozella Rocks, MD   10 mg at 03/05/12 1023  . LORazepam (ATIVAN) tablet 1 mg  1 mg Oral Q6H PRN Phebe Colla, MD       Or  . LORazepam (ATIVAN) injection 1 mg  1 mg Intravenous Q6H PRN Phebe Colla, MD      . metFORMIN (GLUCOPHAGE-XR) 24 hr tablet 500 mg  500 mg Oral Q breakfast Ozella Rocks, MD   500 mg at 03/06/12 0815  . morphine 2 MG/ML injection 2 mg  2 mg Intravenous Once Everrett Coombe, DO   2 mg at 03/05/12 1824  . morphine 2 MG/ML injection           . mulitivitamin with minerals tablet 1 tablet  1 tablet Oral Daily Phebe Colla, MD   1 tablet at 03/05/12 1023  . ondansetron (ZOFRAN) tablet 4 mg  4 mg Oral Q6H PRN Ozella Rocks, MD       Or  . ondansetron (  ZOFRAN) injection 4 mg  4 mg Intravenous Q6H PRN Ozella Rocks, MD      . oxyCODONE-acetaminophen [facility]) 5-325 MG per tablet 1-2 tablet  1-2 tablet Oral Q4H PRN Shelva Majestic, MD   2 tablet at 03/06/12 0615  . pneumococcal 23 valent vaccine (PNU-IMMUNE) injection 0.5 mL  0.5 mL Intramuscular Tomorrow-1000 Leighton Roach McDiarmid, MD   0.5 mL at 03/05/12 1023  . simvastatin (ZOCOR) tablet 40 mg  40 mg Oral q1800 Ozella Rocks, MD   40 mg at 03/05/12 1752  . thiamine (VITAMIN B-1) tablet 100 mg  100 mg Oral Daily Phebe Colla, MD   100 mg at 03/05/12 1023   Or  . thiamine (B-1) injection 100 mg  100 mg Intravenous Daily Phebe Colla, MD      . DISCONTD: azithromycin (ZITHROMAX) tablet 500 mg  500 mg Oral Daily Ozella Rocks, MD      . DISCONTD: morphine 2 MG/ML injection 2 mg  2 mg Intravenous Q3H PRN Ozella Rocks, MD   2 mg at 03/05/12 1023  . DISCONTD: oxyCODONE (Oxy IR/ROXICODONE) immediate release tablet 5 mg  5 mg Oral Q4H PRN Ozella Rocks, MD   5 mg at 03/05/12 0746      PE: Gen: NAD  HEENT: MMM  CV: RRR, no m/r/g  [id number] diminished breath sounds on Rt. Normal effort. On 1L [place]. No crackles  Abd: soft non-tender  Ext/Musc: 2+ pulses  Neuro: CN intact   Labs/Studies:  Basic Metabolic Panel:    Component Value Date/Time   NA 132* 03/06/2012 0615   K 3.6 03/06/2012 0615   CL 97 03/06/2012 0615   CO2 26 03/06/2012 0615   BUN 9 03/06/2012 0615   CREATININE 0.64 03/06/2012 0615   CREATININE 0.89 11/27/2011 1947   GLUCOSE 174* 03/06/2012 0615   CALCIUM 8.8 03/06/2012 0615   CBG (last 3)   Basename 03/06/12 1141 03/06/12 0753 03/05/12 2212  GLUCAP 187* 210* 193*    Assessment/Plan: Kedrick Mcnamee is a 31 y.o. year old male with h/o HLD, DM, HTN, presenting w/ acute onset abdominal pain w/ laboratory and imaging confirmed pancreatitis, now w/ Pneumonia   # Pancreatitis: Etiology likely Etoh induced. Last alcoholic drink less than 36 hrs prior to onset of symptoms. Last lipid panel in February. Lipase 769 on admission now 238 w/ CT imaging significant for pancreatitis in the tail of the pancreas. No sign of biliary stone involvement. Some abdominal pain today. IV morhine x1 - Will change diet back to clear liquid until this dinner. - Will change pain regimen to Percocet, w/ IV morphine prn as pain not fully controlled w/ PO only.  - Benadryl for itching  - Zofran for nausea  # Pneumonia: Pt w/ likely aspiration PNA vs CAP. Afebrile. Still requiring O2, though likely more for symptomatic relief. No desat below 90%. - Augmentin 875 BID   # DM: A1c increased to 11.1 from previous of 9. Pt very poorly controlled. Received 9 units insulin in 24hrs.  - DM education - Continue SSI  - Will likely start Lantus if PO returns - cont Metformin   # FEN/GI:  - ADAT this pm  # Prophylaxis:  - Protonix  - Heparin SQ TID   # Disposition: Pending clinical improvement. Likely DC tomorrow    LOS 3  Signed: Shelly Flatten, MD Family Medicine Resident  PGY-1 [phone] 03/06/2012 8:48 AM

## 2012-03-07 ENCOUNTER — Inpatient Hospital Stay (HOSPITAL_COMMUNITY): Payer: PRIVATE HEALTH INSURANCE

## 2012-03-07 ENCOUNTER — Encounter (HOSPITAL_COMMUNITY): Payer: Self-pay | Admitting: Radiology

## 2012-03-07 LAB — BASIC METABOLIC PANEL
BUN: 7 mg/dL (ref 6–23)
CO2: 25 mEq/L (ref 19–32)
Chloride: 99 mEq/L (ref 96–112)
Glucose, Bld: 156 mg/dL — ABNORMAL HIGH (ref 70–99)
Potassium: 3.4 mEq/L — ABNORMAL LOW (ref 3.5–5.1)
Sodium: 135 mEq/L (ref 135–145)

## 2012-03-07 LAB — URINALYSIS, ROUTINE W REFLEX MICROSCOPIC
Glucose, UA: NEGATIVE mg/dL
Hgb urine dipstick: NEGATIVE
Leukocytes, UA: NEGATIVE
Protein, ur: NEGATIVE mg/dL
Specific Gravity, Urine: 1.026 (ref 1.005–1.030)
pH: 7 (ref 5.0–8.0)

## 2012-03-07 LAB — CBC
MCH: 29.1 pg (ref 26.0–34.0)
MCHC: 33.6 g/dL (ref 30.0–36.0)
MCV: 86.7 fL (ref 78.0–100.0)
WBC: 9.3 10*3/uL (ref 4.0–10.5)

## 2012-03-07 LAB — GLUCOSE, CAPILLARY
Glucose-Capillary: 182 mg/dL — ABNORMAL HIGH (ref 70–99)
Glucose-Capillary: 204 mg/dL — ABNORMAL HIGH (ref 70–99)

## 2012-03-07 MED ORDER — IOHEXOL 300 MG/ML  SOLN
100.0000 mL | Freq: Once | INTRAMUSCULAR | Status: AC | PRN
  Administered 2012-03-07: 100 mL via INTRAVENOUS

## 2012-03-07 NOTE — Plan of Care (Signed)
Problem: Food- and Nutrition-Related Knowledge Deficit (NB-1.1) Goal: Nutrition education Formal process to instruct or train a patient/client in a skill or to impart knowledge to help patients/clients voluntarily manage or modify food choices and eating behavior to maintain or improve health.  Outcome: Completed/Met Date Met:  03/07/12 Patient diagnosed with type 2 diabetes 3 years ago. He has not had formal education, and states he has not changed his diet habits much. He has switched to diet drinks and light food products. I discussed basic carbohydrate counting with the patient. We reviewed foods with carbohydrates, portion control, and meal planning. He was provided with an educational booklet and the contact information for the [facility] for outpatient counseling/classes.

## 2012-03-07 NOTE — Progress Notes (Signed)
I have seen and examined this patient. I have discussed with Dr Konrad Dolores.  I agree with their findings and plans as documented in their progress note for today. Acute Issues 1. Acute alcohol-induced pancreatitis - Improved today. - Advancing diet. - Pain control improved. - If adequate pain control and diet advancing continue, anticipate discharge home tomorrow.  2. Pneumonia - Temperature spike overnite.  - Breathing well this afternoon.  - Pain to complete 7 days of Augmentin.   3. Diabetes Mellitus, Type II, uncontrolled -  Lab Results  Component Value Date   HGBA1C 11.1* 03/04/2012  - Patient started on basal insulin.  - pt being instructed in self-administration of insulin. - Continue Metformin as outpatient.

## 2012-03-07 NOTE — Progress Notes (Signed)
Family Medicine Teaching Service [facility] Progress Note  Patient name: Lee Maldonado Medical record number: [id number] Date of birth: Feb 14, 1981 Age: 31 y.o. Gender: male    LOS: 4 days   Primary Care Provider: Elvina Sidle, MD, MD  Overnight Events:  Worsening abdominal pain and return of fever overnight. Intolerance to meals and return of O2 requirement. Pt received CXR and CT, concerning for worsening aspiration pneumonia  Objective: Vital signs in last 24 hours: Temp:  [98.4 F (36.9 C)-102.2 F (39 C)] 98.9 F (37.2 C) (06/07 7846) Pulse Rate:  [100-120] 100  (06/07 0638) Resp:  [18] 18  (06/07 0638) BP: (143-156)/(74-91) 144/89 mmHg (06/07 0638) SpO2:  [91 %-96 %] 94 % (06/07 0638)  Wt Readings from Last 3 Encounters:  03/04/12 227 lb 6.4 oz (103.148 kg)  11/27/11 227 lb (102.967 kg)     Current Facility-Administered Medications  Medication Dose Route Frequency Provider Last Rate Last Dose  . acetaminophen (TYLENOL) tablet 650 mg  650 mg Oral Q6H PRN Phebe Colla, MD   650 mg at 03/06/12 1732  . amoxicillin-clavulanate (AUGMENTIN) 875-125 MG per tablet 1 tablet  1 tablet Oral Q12H Shelva Majestic, MD   1 tablet at 03/06/12 2204  . antiseptic oral rinse (BIOTENE) solution 15 mL  15 mL Mouth Rinse q12n4p Leighton Roach McDiarmid, MD   15 mL at 03/06/12 1639  . bd getting started take home kit 3/10 ml X 30g syringes 1 kit  1 kit Other Once Leighton Roach McDiarmid, MD   1 kit at 03/06/12 1639  . chlorhexidine (PERIDEX) 0.12 % solution 15 mL  15 mL Mouth Rinse BID Leighton Roach McDiarmid, MD   15 mL at 03/06/12 2046  . diphenhydrAMINE (BENADRYL) injection 12.5 mg  12.5 mg Intravenous Q6H PRN Ozella Rocks, MD      . folic acid (FOLVITE) tablet 1 mg  1 mg Oral Daily Phebe Colla, MD   1 mg at 03/06/12 1007  . heparin injection 5,000 Units  5,000 Units Subcutaneous Q8H Ozella Rocks, MD   5,000 Units at 03/07/12 [phone]  . ibuprofen (ADVIL,MOTRIN) tablet 600 mg  600 mg Oral Q6H PRN Ozella Rocks,  MD   600 mg at 03/06/12 1911  . insulin aspart (novoLOG) injection 0-5 Units  0-5 Units Subcutaneous QHS Ozella Rocks, MD      . insulin aspart (novoLOG) injection 0-9 Units  0-9 Units Subcutaneous TID WC Ozella Rocks, MD   2 Units at 03/06/12 1942  . iohexol (OMNIPAQUE) 300 MG/ML solution 100 mL  100 mL Intravenous Once PRN Medication Radiologist, MD   100 mL at 03/07/12 0017  . iohexol (OMNIPAQUE) 300 MG/ML solution 20 mL  20 mL Oral Q1 Hr x 2 Medication Radiologist, MD   20 mL at 03/06/12 2230  . lisinopril (PRINIVIL,ZESTRIL) tablet 10 mg  10 mg Oral Daily Ozella Rocks, MD   10 mg at 03/06/12 1007  . living well with diabetes book MISC   Does not apply Once Leighton Roach McDiarmid, MD      . LORazepam (ATIVAN) tablet 1 mg  1 mg Oral Q6H PRN Phebe Colla, MD       Or  . LORazepam (ATIVAN) injection 1 mg  1 mg Intravenous Q6H PRN Phebe Colla, MD      . metFORMIN (GLUCOPHAGE-XR) 24 hr tablet 500 mg  500 mg Oral Q breakfast Ozella Rocks, MD   500 mg at 03/06/12 0815  .  morphine 2 MG/ML injection 2 mg  2 mg Intravenous Once Leighton Roach McDiarmid, MD   2 mg at 03/06/12 1538  . morphine 4 MG/ML injection 4 mg  4 mg Intravenous Q4H PRN Leighton Roach McDiarmid, MD   4 mg at 03/06/12 2047  . mulitivitamin with minerals tablet 1 tablet  1 tablet Oral Daily Phebe Colla, MD   1 tablet at 03/06/12 1007  . ondansetron (ZOFRAN) tablet 4 mg  4 mg Oral Q6H PRN Ozella Rocks, MD       Or  . ondansetron [facility]) injection 4 mg  4 mg Intravenous Q6H PRN Ozella Rocks, MD      . oxyCODONE-acetaminophen (PERCOCET) 5-325 MG per tablet 1-2 tablet  1-2 tablet Oral Q4H PRN Shelva Majestic, MD   2 tablet at 03/07/12 0042  . simvastatin (ZOCOR) tablet 40 mg  40 mg Oral q1800 Ozella Rocks, MD   40 mg at 03/06/12 1919  . thiamine (VITAMIN B-1) tablet 100 mg  100 mg Oral Daily Phebe Colla, MD   100 mg at 03/06/12 1007   Or  . thiamine (B-1) injection 100 mg  100 mg Intravenous Daily Phebe Colla, MD      . DISCONTD: bd  getting started take home kit 1/2 ml x 30g syringes 1 kit  1 kit Other Once Leighton Roach McDiarmid, MD      . DISCONTD: morphine 2 MG/ML injection 2 mg  2 mg Intravenous Q4H PRN Ozella Rocks, MD   2 mg at 03/06/12 1426     PE: Gen: NAD HEENT: MMM CV: RRR Res: CTAB minimally diminished at bases w/ R>L, normal effort, no O2 requirement Abd: mildly tender to palpation Ext/Musc: 2+ Pulses  Labs/Studies:  CT: 1. There continues to be extensive inflammatory changes around the  distal body and tail of the pancreas, compatible with acute  pancreatitis. At this time, there is no definitive evidence of  pancreatic necrosis, developing abscess or developing pseudocyst.  2. However, there are findings concerning for developing aspiration  pneumonia in the right middle and lower lobes. There is also a  small left-sided pleural effusion with some passive atelectasis in  the left lower lobe.  CXR: 1. Worsening airspace consolidation in the right lower lobe  concerning for pneumonia, with increasing small right-sided  parapneumonic pleural effusion.  2. Left lower lobe subsegmental atelectasis.     Assessment/Plan:  Lee Maldonado is a 31 y.o. year old male with h/o HLD, DM, HTN, presenting w/ acute onset abdominal pain w/ laboratory and imaging confirmed pancreatitis, now w/ aspiration Pneumonia   # Pancreatitis: Etiology likely Etoh induced. CT imaging significant for pancreatitis in the tail of the pancreas. Some worsening abdominal pain today.  - continue morphine and percocet for pain control - Will change diet back to clear liquid until this dinner.  - Will change pain regimen to Percocet, w/ IV morphine prn as pain not fully controlled w/ PO only.  - Benadryl for itching  - Zofran for nausea   # Pneumonia: Pt w/ worsening aspiration PNA despite treatment w/ augmentin. Desats to 91% on RA w/ increased WOB overnight. Now requiring O2 and febrile. Abdominal pain likely from both pancreatitis  and pna.   - Currently on Augmentin 875 BID,  - Change ABX to avelox if becomes febrile or w/ respiratory compromise today.   # DM: A1c increased to 11.1 from previous of 9. Pt very poorly controlled. Received 9 units  insulin in 24hrs.  - DM education  - Continue SSI  - Will start Lantus tonight if PO returns - cont Metformin   # FEN/GI:  - ADAT   # Prophylaxis:  - Protonix  - Heparin SQ TID   # Disposition: Pending clinical improvement.   LOS 4  Signed: Shelly Flatten, MD Family Medicine Resident PGY-1 [phone] 03/07/2012 6:42 AM

## 2012-03-08 LAB — GLUCOSE, CAPILLARY: Glucose-Capillary: 204 mg/dL — ABNORMAL HIGH (ref 70–99)

## 2012-03-08 MED ORDER — OXYCODONE-ACETAMINOPHEN 5-325 MG PO TABS: 1.0000 | ORAL_TABLET | ORAL | Status: DC | PRN

## 2012-03-08 MED ORDER — AMOXICILLIN-POT CLAVULANATE 875-125 MG PO TABS: 1.0000 | ORAL_TABLET | Freq: Two times a day (BID) | ORAL | Status: AC

## 2012-03-08 MED ORDER — INSULIN GLARGINE 100 UNIT/ML ~~LOC~~ SOLN: 5.0000 [IU] | Freq: Every day | SUBCUTANEOUS | Status: DC

## 2012-03-08 MED ORDER — INSULIN GLARGINE 100 UNIT/ML ~~LOC~~ SOLN
5.0000 [IU] | Freq: Every day | SUBCUTANEOUS | Status: DC
  Administered 2012-03-08: 5 [IU] via SUBCUTANEOUS

## 2012-03-08 MED ORDER — INSULIN ASPART 100 UNIT/ML ~~LOC~~ SOLN: 0.0000 [IU] | Freq: Three times a day (TID) | SUBCUTANEOUS | Status: DC

## 2012-03-08 MED ORDER — ONDANSETRON HCL 4 MG PO TABS: 4.0000 mg | ORAL_TABLET | Freq: Four times a day (QID) | ORAL | Status: AC | PRN

## 2012-03-08 NOTE — Discharge Summary (Signed)
Family Medicine Resident Discharge Summary  Patient ID: Lee Maldonado [id number] 31 y.o. 04-May-1981  Admit date: 03/03/2012  Discharge date and time: No discharge date for patient encounter.   Admitting Physician: Leighton Roach McDiarmid, MD   Discharge Physician: Todd McDiarmid  Admission Diagnoses: Acute pancreatitis [577.0] stomach side pain  Discharge Diagnoses: Acute alcoholic pancreatitis, Aspiration PNA  Admission Condition: fair  Discharged Condition: good  Indication for Admission: Severe abdominal pain w/ CT confirmed pancreatitis  Hospital Course: Lee Maldonado is a 31 y.o. year old male with h/o HLD, DM, HTN, presenting w/ acute onset abdominal pain w/ laboratory and imaging confirmed pancreatitis.  Pancreatitis: Pt w/ h/o significant for binge drinking approximately 30hrs prior to onset of severe abdominal pain and volumonus emesis episode x1. CT showed inflammation of the pacreas, predominantly in the tail w/o biliary involvement and diffuse fatty infiltrate of the liver. Pt lipase on admission was 769. Pt made NPO (except for meds), started on NS IVF at 159ml/hr. Started on IV morphine 2mg  Q3hrs PRN and Oxycodone 5mg  Q4hrs PRN. Pain was well controlled and diminished significantly. Pt allowed to advance diet as tolerated as pain subsided. Pain initially improved but worsened on his second day of admission. Pain was worse w/ long ambulation, not with food, but diet was restricted to clears again to ensure pancreatic rest. Diet again advanced as pain resolved and pt able to control pain w/ PRN percocet and IV morphine. Pt felt pain did not require morphine and was able to be DC w/ percocet only for pain control.  Etoh: Pts pancreatitis likely due to excessive ETOH ingestion. Pt admitted to significant Etoh use, but does not feel he has an alcohol problem. Pt rejected offers by SW and physicians to seek additional help for his alcohol problem.   PNA: Pt w/ significant aspiration hx on  admission. Pt developed a fever and an O2 requirement on 03/06/12 briefly. Repeat CXR and CT concerning for aspiration PNA. Pt started on Augmentin BID. Pt quickly weaned to RA and did well. Pt used incentive spirometry throughout admission. White count 16.5 on admission which improved to normal at time of DC. Pt sent home on w/ Augmentin to finish 7 day course.  DM: Pt w/ glucose of 272 on admission. HgbA1c was obtained and found to be 11.1. Pt only on Metformin 500mg  Qday on admission. As pt started taking PO, insulin and eventually Lantus was started. Diabetes education was undertaken and supplies provided. Pt very aware of condition and what he needs to do to better control it. Pt CBG in the high 100 to low 200 range at time of DC. DCd w/ novolog and lantus and home dose metformin. Pt to call to see PCP for further instruction. Pt very motivated to make lifestyle changes.    Consults: none  Significant Diagnostic Studies: Lab Results   Component  Value  Date/Time    NA  136  03/04/2012 4:27 AM    K  4.7  03/04/2012 4:27 AM    CL  98  03/04/2012 4:27 AM    CO2  23  03/03/2012 11:40 PM    BUN  9  03/04/2012 4:27 AM    CREATININE  0.70  03/04/2012 4:27 AM    CREATININE  0.89  11/27/2011 7:47 PM    GLUCOSE  264*  03/04/2012 4:27 AM    Lab Results   Component  Value  Date    WBC  16.5*  03/03/2012    HGB  14.3  03/04/2012    HCT  42.0  03/04/2012    MCV  86.7  03/03/2012    PLT  359  03/03/2012    Lipase    Component  Value  Date/Time    LIPASE  769*  03/04/2012 0105    Dg Chest 2 View  03/03/2012 *RADIOLOGY REPORT* Clinical Data: Chest pain. History of hypertension. CHEST - 2 VIEW Comparison: No priors. Findings: Lung volumes are normal. No consolidative airspace disease. No pleural effusions. The mild diffuse interstitial prominence and peribronchial cuffing is noted. Pulmonary vasculature is normal. Heart size is normal. Mediastinal contours are unremarkable. IMPRESSION: 1. Findings, as above, suggestive of mild  acute bronchitis. Original Report Authenticated By: Florencia Reasons, M.D.   CT ABDOMEN AND PELVIS WITH CONTRAST 1. CT findings consistent with acute pancreatitis involving the tail region of the pancreas without complicating features such as hemorrhage or necrosis. Moderate fluid in the anterior pararenal space on the left. 2. Diffuse fatty infiltration of the liver. 3. Normal CT appearance of the gallbladder and normal caliber common bile duct. Original Report Authenticated By: P. Loralie Champagne, M.D.   Troponin (Point of Care Test)   Basename  03/03/12 2240   TROPIPOC  0.00    Discharge Exam: Gen: NAD  HEENT: MMM  CV: RRR, no m/r/g  Res: on RA, normal effort Abd: soft, mild tenderness on deep palpation  Ext/Musc: 2+ pulses  Neuro: CN intact   Disposition: home  Patient Instructions:  Medication List  As of 03/08/2012  2:18 PM   TAKE these medications         amoxicillin-clavulanate 875-125 MG per tablet   Commonly known as: AUGMENTIN   Take 1 tablet by mouth every 12 (twelve) hours.      insulin aspart 100 UNIT/ML injection   Commonly known as: novoLOG   Inject 0-9 Units into the skin 3 (three) times daily with meals. Blood Sugar  Low Dose    60-110  No Insulin  111-150  2 units  151-200  4 units   20 1-250  6 units  251-300  8 units   301-350  10 units   >350              12 units Call MD    For blood sugar < 60 mg/dL  . If patient can eat or drink, give 15 g of carbohydrate (4 oz fruit juice, soda, or 3-4 glucose tablets)  . Check blood sugar every 15 minutes and repeat above if < 80 mg/dL      insulin glargine 161 UNIT/ML injection   Commonly known as: LANTUS   Inject 5 Units into the skin daily.      lisinopril 10 MG tablet   Commonly known as: PRINIVIL,ZESTRIL   Take 10 mg by mouth daily.      metFORMIN 500 MG 24 hr tablet   Commonly known as: GLUCOPHAGE-XR   Take 500 mg by mouth daily with breakfast.      ondansetron 4 MG disintegrating  tablet   Commonly known as: ZOFRAN-ODT   Take 1 tablet (4 mg total) by mouth every 8 (eight) hours as needed for nausea.      ondansetron 4 MG tablet   Commonly known as: ZOFRAN   Take 1 tablet (4 mg total) by mouth every 6 (six) hours as needed for nausea.      oxyCODONE-acetaminophen 5-325 MG per tablet   Commonly known as: PERCOCET   Take 1 tablet by mouth  every 4 (four) hours as needed for pain. May take 2 tablets PO q 6 hours for severe pain - Do not take with Tylenol as this tablet already contains tylenol      oxyCODONE-acetaminophen 5-325 MG per tablet   Commonly known as: PERCOCET   Take 1-2 tablets by mouth every 4 (four) hours as needed.      pravastatin 40 MG tablet   Commonly known as: PRAVACHOL   Take 40 mg by mouth daily.      promethazine 25 MG tablet   Commonly known as: PHENERGAN   Take 1 tablet (25 mg total) by mouth every 6 (six) hours as needed for nausea.            Activity: no strenuous exercise for 1wk Diet: Low fat diet  Follow-up with [facility]  Follow-up Items: 1. Augmenting for Aspiration PNA (7 day course) 2. Alcoholic Pancreatitis - Pt w/ Percocet #30. Instructed to monitor pain and eat low fat diet and advance slowly 3. DM - pt at a point of being ready to control DM. A1c >11 as above. Would like to try more PO meds.  4. F/u on alcohol use.   Signed: Shelly Flatten, MD Family Medicine Resident PGY-1 [phone] 03/08/2012 2:18 PM

## 2012-03-08 NOTE — Plan of Care (Signed)
Problem: Phase II Progression Outcomes Goal: Blood sugar < 150 Outcome: Not Met (add Reason) Patient has been taught and returned demonstrated drawing and giving insulin. 03/09/12 1015

## 2012-03-08 NOTE — Discharge Summary (Signed)
I discussed with Dr Charmian Muff.  I agree with their plans documented in their discharge note for today.

## 2012-03-08 NOTE — Progress Notes (Signed)
Reviewed discharge instructions with patient. Follow up appointment to be made. Prescriptions given and 2 scripts were  electronically sent to his pharmacy. Completion of antibiotics was emphasized. Patient had return demonstrated insulin injection prior to discharge. Verbalized understanding of hypoglycemia and carb modified diet, also low fat diet for pancreatitis. Pancreatitis literature and insulin literature were printed for patient. Patient felt comfortable with information and is being discharged.

## 2012-03-08 NOTE — Progress Notes (Signed)
Patient has returned demonstrated drawing and giving insulin. He is apprehensive about going home on insulin. Would like to talk to the doctor about increasing po diabetic medication or adding a new medication by mouth.

## 2012-03-08 NOTE — Discharge Instructions (Signed)
You were admitted for pancreatitis related to alcohol ingestion. Please eat a low fat diet for the next several days, then gradually add back additional foods. If your abdomen starts to hurt then stop and go back to only drinking fluids and advance your diet slowly. If your pain is severe and is not relieved by your pain medicaitons please come back to the emergency room. Please follow up with [facility] on Monday to follow-up from your hospitalization and to discuss your diabetic medication options. Please use your insulin as instructed.      Acute Pancreatitis Acute pancreatitis is the sudden irritation (inflammation) of the pancreas. The pancreas produces digestive juices or enzymes that break down food. The pancreas also makes 2 hormones that control your blood sugar. If the pancreas is irritated, the enzymes attack internal structures and tissues become damaged. When this happens, the pancreas fails to make new enzymes that break down food. HOME CARE  Eat small meals often. Avoid fatty, greasy, and fried foods.   Follow diet instructions given to you by your doctor.   Avoid foods or drinks that may have started the problem (like alcohol).   Drink enough fluids to keep your pee (urine) clear or pale yellow.   Only take medicine as told by your doctor.   Rest often.   Check your blood sugar at home if you are told to.   Keep all your follow-up visits. This is very important.  GET HELP RIGHT AWAY IF:   You do not get better in the time your doctor said you would.   You have pain, weakness, or feel sick to your stomach (nauseous).   You recover but then have another episode of pain.   You are unable to eat or keep liquids down.   Your pain gets worse or changes.   You have a temperature by mouth above 102 F (38.9 C), not controlled by medicine.   Your skin or the white part of your eyes look yellow.   You start to throw up (vomit).   You feel dizzy or pass out  (faint).   Your blood sugar is high (over 300).  MAKE SURE YOU:   Understand these instructions.   Will watch your condition.   Will get help right away if you are not doing well or get worse.  Document Released: 03/05/2008 Document Revised: 09/06/2011 Document Reviewed: 03/05/2008 [facility] Patient Information 2012 [place], [place].

## 2012-03-10 ENCOUNTER — Ambulatory Visit (INDEPENDENT_AMBULATORY_CARE_PROVIDER_SITE_OTHER): Payer: PRIVATE HEALTH INSURANCE | Admitting: Emergency Medicine

## 2012-03-10 ENCOUNTER — Telehealth: Payer: Self-pay

## 2012-03-10 ENCOUNTER — Ambulatory Visit: Payer: PRIVATE HEALTH INSURANCE

## 2012-03-10 ENCOUNTER — Telehealth: Payer: Self-pay | Admitting: Family Medicine

## 2012-03-10 VITALS — BP 135/85 | HR 79 | Temp 97.9°F | Resp 16 | Ht 68.0 in | Wt 225.2 lb

## 2012-03-10 DIAGNOSIS — G4733 Obstructive sleep apnea (adult) (pediatric): Secondary | ICD-10-CM

## 2012-03-10 DIAGNOSIS — K859 Acute pancreatitis without necrosis or infection, unspecified: Secondary | ICD-10-CM

## 2012-03-10 DIAGNOSIS — E119 Type 2 diabetes mellitus without complications: Secondary | ICD-10-CM

## 2012-03-10 DIAGNOSIS — J189 Pneumonia, unspecified organism: Secondary | ICD-10-CM

## 2012-03-10 LAB — POCT CBC
Lymph, poc: 2.4 (ref 0.6–3.4)
MCH, POC: 27.9 pg (ref 27–31.2)
MCHC: 31.5 g/dL — AB (ref 31.8–35.4)
MCV: 88.6 fL (ref 80–97)
MID (cbc): 0.9 (ref 0–0.9)
Platelet Count, POC: 407 10*3/uL (ref 142–424)
RBC: 4.41 M/uL — AB (ref 4.69–6.13)
WBC: 13 10*3/uL — AB (ref 4.6–10.2)

## 2012-03-10 MED ORDER — OXYCODONE-ACETAMINOPHEN 10-325 MG PO TABS: 1.0000 | ORAL_TABLET | ORAL | Status: DC | PRN

## 2012-03-10 NOTE — Telephone Encounter (Signed)
See msg from [facility], patient is able to pay OOP today or wait and fill tomorrow through insurance.

## 2012-03-10 NOTE — Progress Notes (Signed)
  Subjective:    Patient ID: Lee Maldonado, male    DOB: 06-Apr-1981, 31 y.o.   MRN: [id number]  HPI patient here to followup recent hospitalization for hyperglycemia pancreatitis and pneumonia. His episode of pancreatitis following an episode of heavy drinking. In the hospital he was placed on insulin. Yesterday after using his Lantus pen for the first time he  developed swelling of his lower lip. He has been feeling better since discharge. His abdominal pain is improved. He is not vomiting. His pain level is about a 5/10 at the present time    Review of Systems     Objective:   Physical Exam  Constitutional: He appears well-developed and well-nourished.  HENT:  Head: Normocephalic.  Eyes: Pupils are equal, round, and reactive to light.  Neck: Normal range of motion. Neck supple. No thyromegaly present.  Cardiovascular: Normal rate, regular rhythm and normal heart sounds.   Pulmonary/Chest: Effort normal and breath sounds normal. No respiratory distress. He has no wheezes. He has no rales. He exhibits no tenderness.  Abdominal: There is tenderness. There is rebound and guarding.       There is diffuse tenderness in the abdomen especially in the epigastric area. There is borderline rebound present   [facility] reading (PRIMARY) by  Dr. Cleta Alberts the right lower lobe infiltrate is improved there is now an infiltrate on the left  Results for orders placed in visit on 03/10/12  POCT CBC      Component Value Range   WBC 13.0 (*) 4.6 - 10.2 (K/uL)   Lymph, poc 2.4  0.6 - 3.4    POC LYMPH PERCENT 18.8  10 - 50 (%L)   MID (cbc) 0.9  0 - 0.9    POC MID % 7.2  0 - 12 (%M)   POC Granulocyte 9.6 (*) 2 - 6.9    Granulocyte percent 74.0  37 - 80 (%G)   RBC 4.41 (*) 4.69 - 6.13 (M/uL)   Hemoglobin 12.3 (*) 14.1 - 18.1 (g/dL)   HCT, POC 10.2 (*) 72.5 - 53.7 (%)   MCV 88.6  80 - 97 (fL)   MCH, POC 27.9  27 - 31.2 (pg)   MCHC 31.5 (*) 31.8 - 35.4 (g/dL)   RDW, POC 36.6     Platelet Count, POC 407  142 -  424 (K/uL)   MPV 8.1  0 - 99.8 (fL)  GLUCOSE, POCT (MANUAL RESULT ENTRY)      Component Value Range   POC Glucose 258 (*) 70 - 99 (mg/dl)         Assessment & Plan:   patient in for followup pancreatitis pneumonia and diabetes. Check a sugar and white count today as well as chest x-ray. It appears he had an episode of angioedema related to his Lantus.Marland KitchenHis WBC is still up. Will keep OOW and cont antibiotics. Re check Thursday. He's not under good control with a blood sugar of 258. He's not able to take his Lantus so he will use his I told patient that pancreatitis can be a life-threatening illness and that if he presents with worsening pain he should present himself to the emergency room. His chest x-ray showed an infiltrate on the left that was not present on his initial film was pulse ox was good and he is still on Augmentin antibiotics.

## 2012-03-10 NOTE — Patient Instructions (Signed)
Did not take anymore Lantus insulin. The NovoLog insulin should be used instead. Recheck here on Thursday. Unable to work until rechecked .

## 2012-03-10 NOTE — Telephone Encounter (Signed)
Patient states that ins will not cover Percocet. Patient needs prior auth. Patient states without medication is thinks he will be in ER. He also is going to try to contact ins company to see if they will call us about rx.

## 2012-03-10 NOTE — Telephone Encounter (Signed)
Patient recently discharged from [facility] after hospitalization for pancreatitis.  Received call through [facility] after hours line from wife stating that he is almost out of pain medications (4 pills left) and still having a good bit of pain.  Was given a refill for percocet today by PCP's office however insurance will not pay for this until 6/12, per wife.  Called pharmacy and confirmed that insurance will not pay for another rx for percocet at this time, because previous rx billed as a 3 day supply.  Pharmacy said that they would likely be able to fill tomorrow on 6/11 and have insurance pay.  Other option is to pay $34.99 out of pocket for new rx.  He is considering coming to ED because he is afraid that he will run out of meds and pain will not be controlled.   Called patients wife back and gave her information about possibly getting rx filled tomorrow vs. Paying out of pocket and that paying out of pocket for pain meds would be cheaper than ED visit or hospitalization if he could not stretch the 4 remaining pills until the morning.  Wife agrees, will discuss with patient.  Everrett Coombe, DO

## 2012-03-11 LAB — COMPREHENSIVE METABOLIC PANEL
Albumin: 3.8 g/dL (ref 3.5–5.2)
Alkaline Phosphatase: 136 U/L — ABNORMAL HIGH (ref 39–117)
BUN: 10 mg/dL (ref 6–23)
Calcium: 9.5 mg/dL (ref 8.4–10.5)
Chloride: 98 mEq/L (ref 96–112)
Glucose, Bld: 240 mg/dL — ABNORMAL HIGH (ref 70–99)
Potassium: 4.9 mEq/L (ref 3.5–5.3)

## 2012-03-11 LAB — LIPASE: Lipase: 74 U/L (ref 0–75)

## 2012-03-11 NOTE — Telephone Encounter (Signed)
Pt already contacted by [employer] about ins coverage for Percocet.

## 2012-03-11 NOTE — Telephone Encounter (Signed)
Note reviewed. Sounds like a good plan.

## 2012-03-13 ENCOUNTER — Ambulatory Visit (INDEPENDENT_AMBULATORY_CARE_PROVIDER_SITE_OTHER): Payer: PRIVATE HEALTH INSURANCE | Admitting: Emergency Medicine

## 2012-03-13 VITALS — BP 115/75 | HR 77 | Temp 98.1°F | Resp 18 | Ht 68.0 in | Wt 225.0 lb

## 2012-03-13 DIAGNOSIS — K859 Acute pancreatitis without necrosis or infection, unspecified: Secondary | ICD-10-CM

## 2012-03-13 DIAGNOSIS — J189 Pneumonia, unspecified organism: Secondary | ICD-10-CM

## 2012-03-13 DIAGNOSIS — E119 Type 2 diabetes mellitus without complications: Secondary | ICD-10-CM

## 2012-03-13 MED ORDER — METFORMIN HCL ER 500 MG PO TB24: 500.0000 mg | ORAL_TABLET | Freq: Two times a day (BID) | ORAL | Status: DC

## 2012-03-13 NOTE — Progress Notes (Signed)
  Subjective:    Patient ID: Lee Maldonado, male    DOB: 09/14/1981, 31 y.o.   MRN: [id number]  HPI patient is to follow up recent hospitalization for pancreatitis. He is done well since stopping alcohol he no longer has any nausea vomiting.    Review of Systems     Objective:   Physical Exam HEENT exam negative chest clear to auscultation and percussion heart regular rate no murmurs. Abdomen soft not tender.        Assessment & Plan:  Patient is doing well. He is doing well since he stopped drinking sugar control. He's currently on a sliding scale of NovoLog his chest x-ray was abnormal fluid in the right side and atelectasis left base. He does need a followup chest x-ray done. He will return to work in 3 weeks repeat blood work at that time along with a chest x-ray .

## 2012-03-14 ENCOUNTER — Encounter: Payer: Self-pay | Admitting: Emergency Medicine

## 2012-03-16 ENCOUNTER — Ambulatory Visit: Payer: PRIVATE HEALTH INSURANCE

## 2012-03-16 ENCOUNTER — Ambulatory Visit (INDEPENDENT_AMBULATORY_CARE_PROVIDER_SITE_OTHER): Payer: PRIVATE HEALTH INSURANCE | Admitting: Emergency Medicine

## 2012-03-16 VITALS — BP 106/71 | HR 90 | Temp 98.0°F | Resp 16

## 2012-03-16 DIAGNOSIS — J189 Pneumonia, unspecified organism: Secondary | ICD-10-CM

## 2012-03-16 DIAGNOSIS — K859 Acute pancreatitis without necrosis or infection, unspecified: Secondary | ICD-10-CM

## 2012-03-16 DIAGNOSIS — E119 Type 2 diabetes mellitus without complications: Secondary | ICD-10-CM

## 2012-03-16 LAB — POCT CBC
Lymph, poc: 2.1 (ref 0.6–3.4)
MCH, POC: 28.6 pg (ref 27–31.2)
MCHC: 32.4 g/dL (ref 31.8–35.4)
MCV: 88.3 fL (ref 80–97)
MID (cbc): 0.6 (ref 0–0.9)
POC LYMPH PERCENT: 36.9 %L (ref 10–50)
POC MID %: 10.2 %M (ref 0–12)
Platelet Count, POC: 790 10*3/uL — AB (ref 142–424)
RDW, POC: 13.8 %
WBC: 5.7 10*3/uL (ref 4.6–10.2)

## 2012-03-16 LAB — GLUCOSE, POCT (MANUAL RESULT ENTRY): POC Glucose: 171 mg/dl — AB (ref 70–99)

## 2012-03-16 MED ORDER — OXYCODONE-ACETAMINOPHEN 10-325 MG PO TABS: 1.0000 | ORAL_TABLET | Freq: Four times a day (QID) | ORAL | Status: DC | PRN

## 2012-03-16 MED ORDER — OXYCODONE-ACETAMINOPHEN 10-325 MG PO TABS: 1.0000 | ORAL_TABLET | Freq: Four times a day (QID) | ORAL | Status: AC | PRN

## 2012-03-16 NOTE — Progress Notes (Signed)
Subjective:    Patient ID: Lee Maldonado, male    DOB: January 21, 1981, 31 y.o.   MRN: [id number]  HPI patient been followed here for diabetes requiring insulin with each meal the he also spent 5 days in the hospital with acute pancreatitis that was precipitated by alcohol. It seemed to be doing well until Friday night he had fish and some potato chips and about midnight had an episode of nausea. He felt weak all day and felt return visit pain in the abdomen. Today he still feels somewhat nauseated his pain at times has been a 5/10 similar to the pain that required his hospitalization. Also notices less chest x-ray showed resolving right lower lobe pneumonia with an area of atelectasis in the left lung. He denies cough or respiratory symptoms to    Review of Systems     Objective:   Physical Exam  Constitutional: He appears well-developed and well-nourished.  HENT:  Head: Normocephalic.  Right Ear: External ear normal.  Left Ear: External ear normal.  Eyes: Pupils are equal, round, and reactive to light.  Neck: No thyromegaly present.  Cardiovascular: Normal rate, regular rhythm and normal heart sounds.   Pulmonary/Chest: Effort normal. No respiratory distress. He has no wheezes.  Abdominal: Soft. Bowel sounds are normal. He exhibits no distension. There is tenderness. There is no rebound.       There is minimal tenderness in the midepigastrium the   Results for orders placed in visit on 03/16/12  POCT CBC      Component Value Range   WBC 5.7  4.6 - 10.2 K/uL   Lymph, poc 2.1  0.6 - 3.4   POC LYMPH PERCENT 36.9  10 - 50 %L   MID (cbc) 0.6  0 - 0.9   POC MID % 10.2  0 - 12 %M   POC Granulocyte 3.0  2 - 6.9   Granulocyte percent 52.9  37 - 80 %G   RBC 4.72  4.69 - 6.13 M/uL   Hemoglobin 13.5 (*) 14.1 - 18.1 g/dL   HCT, POC 91.4 (*) 78.2 - 53.7 %   MCV 88.3  80 - 97 fL   MCH, POC 28.6  27 - 31.2 pg   MCHC 32.4  31.8 - 35.4 g/dL   RDW, POC 95.6     Platelet Count, POC 790 (*) 142 -  424 K/uL   MPV 7.7  0 - 99.8 fL   [facility] reading (PRIMARY) by  Dr.Leialoha Maldonado resolution of the previously described infiltrate left lung. The right lung also appears normal on this chest film Lee Maldonado  Results for orders placed in visit on 03/16/12  POCT CBC      Component Value Range   WBC 5.7  4.6 - 10.2 K/uL   Lymph, poc 2.1  0.6 - 3.4   POC LYMPH PERCENT 36.9  10 - 50 %L   MID (cbc) 0.6  0 - 0.9   POC MID % 10.2  0 - 12 %M   POC Granulocyte 3.0  2 - 6.9   Granulocyte percent 52.9  37 - 80 %G   RBC 4.72  4.69 - 6.13 M/uL   Hemoglobin 13.5 (*) 14.1 - 18.1 g/dL   HCT, POC 21.3 (*) 08.6 - 53.7 %   MCV 88.3  80 - 97 fL   MCH, POC 28.6  27 - 31.2 pg   MCHC 32.4  31.8 - 35.4 g/dL   RDW, POC 57.8     Platelet Count, POC  790 (*) 142 - 424 K/uL   MPV 7.7  0 - 99.8 fL  GLUCOSE, POCT (MANUAL RESULT ENTRY)      Component Value Range   POC Glucose 171 (*) 70 - 99 mg/dl       Assessment & Plan:  We'll go ahead and check CBC amylase lipase and glucose. We'll also repeat his chest x-ray. It does not appear he is going to be able to return to work tomorrow. His platelets are at this visit I not sure I would just be to repeat in a week. His chest x-ray is significantly better.

## 2012-03-28 ENCOUNTER — Other Ambulatory Visit: Payer: Self-pay | Admitting: Family Medicine

## 2012-03-28 ENCOUNTER — Other Ambulatory Visit: Payer: Self-pay | Admitting: Emergency Medicine

## 2012-03-31 ENCOUNTER — Other Ambulatory Visit: Payer: Self-pay | Admitting: *Deleted

## 2012-03-31 MED ORDER — GLUCOSE BLOOD VI STRP: ORAL_STRIP | Status: DC

## 2012-04-02 ENCOUNTER — Ambulatory Visit (INDEPENDENT_AMBULATORY_CARE_PROVIDER_SITE_OTHER): Payer: PRIVATE HEALTH INSURANCE | Admitting: Emergency Medicine

## 2012-04-02 VITALS — BP 110/62 | HR 90 | Temp 98.6°F | Resp 16 | Ht 67.0 in | Wt 210.2 lb

## 2012-04-02 DIAGNOSIS — E119 Type 2 diabetes mellitus without complications: Secondary | ICD-10-CM

## 2012-04-02 DIAGNOSIS — K859 Acute pancreatitis without necrosis or infection, unspecified: Secondary | ICD-10-CM

## 2012-04-02 DIAGNOSIS — R1084 Generalized abdominal pain: Secondary | ICD-10-CM

## 2012-04-02 NOTE — Progress Notes (Signed)
  Subjective:    Patient ID: Lee Maldonado, male    DOB: Feb 17, 1981, 31 y.o.   MRN: [id number]  HPI patient in followup recent admission for pancreatitis and diabetes as well as alcohol induced patient is doing very well recently. There is some times he has been checking his sugar and not had to use his short acting insulin on sliding scale. He is feeling significantly better able to eat and is responding well to his current treatment program. He is currently on sliding scale hasn't been checking sugars 3 times a day to    Review of Systems     Objective:   Physical Exam  Constitutional: He appears well-developed.  HENT:  Head: Normocephalic.  Eyes: Pupils are equal, round, and reactive to light.  Neck: No thyromegaly present.  Cardiovascular: Normal rate and regular rhythm.   Pulmonary/Chest: No respiratory distress. He has no wheezes. He has no rales.  Abdominal: He exhibits no distension. There is no tenderness. There is no rebound.   sugar checked this morning was 117 lunch time sugar was 127        Assessment & Plan:  Patient has recovered from his recent episode of pancreatitis and now has his diabetes under control with regular sliding scale insulin to recheck in about 6 weeks. Hopefully with healing of his pancreas he will be in to get off of his insulin.

## 2012-06-21 ENCOUNTER — Ambulatory Visit (INDEPENDENT_AMBULATORY_CARE_PROVIDER_SITE_OTHER): Payer: PRIVATE HEALTH INSURANCE | Admitting: Family Medicine

## 2012-06-21 VITALS — BP 124/82 | HR 68 | Temp 98.1°F | Resp 16 | Ht 67.25 in | Wt 208.4 lb

## 2012-06-21 DIAGNOSIS — E119 Type 2 diabetes mellitus without complications: Secondary | ICD-10-CM

## 2012-06-21 DIAGNOSIS — I10 Essential (primary) hypertension: Secondary | ICD-10-CM

## 2012-06-21 DIAGNOSIS — E7800 Pure hypercholesterolemia, unspecified: Secondary | ICD-10-CM

## 2012-06-21 DIAGNOSIS — L03119 Cellulitis of unspecified part of limb: Secondary | ICD-10-CM

## 2012-06-21 DIAGNOSIS — W57XXXA Bitten or stung by nonvenomous insect and other nonvenomous arthropods, initial encounter: Secondary | ICD-10-CM

## 2012-06-21 DIAGNOSIS — S80869A Insect bite (nonvenomous), unspecified lower leg, initial encounter: Secondary | ICD-10-CM

## 2012-06-21 DIAGNOSIS — Z23 Encounter for immunization: Secondary | ICD-10-CM

## 2012-06-21 DIAGNOSIS — L02419 Cutaneous abscess of limb, unspecified: Secondary | ICD-10-CM

## 2012-06-21 LAB — POCT URINALYSIS DIPSTICK
Bilirubin, UA: NEGATIVE
Ketones, UA: 15
Leukocytes, UA: NEGATIVE
Spec Grav, UA: >=1.03

## 2012-06-21 LAB — POCT CBC
Granulocyte percent: 56.8 %G (ref 37–80)
MCV: 91.3 fL (ref 80–97)
MID (cbc): 0.7 (ref 0–0.9)
MPV: 8.4 fL (ref 0–99.8)
POC Granulocyte: 4.5 (ref 2–6.9)
POC LYMPH PERCENT: 35 %L (ref 10–50)
POC MID %: 8.2 %M (ref 0–12)
Platelet Count, POC: 336 10*3/uL (ref 142–424)
RDW, POC: 14 %

## 2012-06-21 LAB — POCT UA - MICROSCOPIC ONLY: Mucus, UA: POSITIVE

## 2012-06-21 MED ORDER — METFORMIN HCL ER 500 MG PO TB24: 500.0000 mg | ORAL_TABLET | Freq: Two times a day (BID) | ORAL | Status: DC

## 2012-06-21 MED ORDER — PRAVASTATIN SODIUM 40 MG PO TABS: 40.0000 mg | ORAL_TABLET | Freq: Every day | ORAL | Status: DC

## 2012-06-21 MED ORDER — INSULIN ASPART 100 UNIT/ML ~~LOC~~ SOLN: SUBCUTANEOUS | Status: DC

## 2012-06-21 MED ORDER — GLUCOSE BLOOD VI STRP: ORAL_STRIP | Status: DC

## 2012-06-21 MED ORDER — AMOXICILLIN-POT CLAVULANATE 875-125 MG PO TABS: 1.0000 | ORAL_TABLET | Freq: Two times a day (BID) | ORAL | Status: DC

## 2012-06-21 MED ORDER — LISINOPRIL 10 MG PO TABS: 10.0000 mg | ORAL_TABLET | Freq: Every day | ORAL | Status: DC

## 2012-06-21 NOTE — Progress Notes (Signed)
[address]   [place], [place]  [address]   [phone]  Subjective:    Patient ID: Lee Maldonado, male    DOB: 1981/04/30, 31 y.o.   MRN: [id number]  HPIThis 31 y.o. male presents for evaluation of the following:  1.  DMII:  Onset three years ago.  Last follow-up in 03/2012 with Dr. Cleta Alberts.  No changes to management made at last visit; Metformin 500mg  twice daily; insulin SSI---usually takes 2 units before each meal tid.    Checking sugars tid; sugars ranging 100-200.  Rare sugars over 200.  No flu vaccine this year.  Pneumovax unknown.  Hepatitis B series not sure.  Ophthalmology exam 4 months ago; unknown name.    2.  HTN:  Follow-up for HTN; no changes made to management at last visit; not checking BP at home.  No chest pain, palpitations, shortness of breath, leg swelling   Compliance with medications; no side effects to lisinopril.  Diagnosed with HTN three years ago.  3.  Hypercholesterolemia:  Compliance with medications.  Good tolerance of medication; good symptom control; good tolerance of medication.  Denies HA, dizziness, focal weakness, paresthesias.  4.  Pancreatitis:  03/2012; hospitalized for 1 week; due to alcoholism and hyperglycemia.  Quit drinking 03/2012.  Weekend drinker only.  Quit smoking in 03/2012 also.    5.  R knee swelling, pain, itching:  Awoke three days ago with two bites to inner aspect of knee.  +lots of itching today.  Swelling three days ago but has gradually worsened.  Now has pain.  +tightness.  No fever/chills/sweats.  No malaise or fatigue.  No medications or topical ointments.  Last Tetanus vaccine in past year.     Review of Systems  Constitutional: Negative for fever, chills, diaphoresis and fatigue.  Respiratory: Negative for cough, choking, shortness of breath, wheezing and stridor.   Cardiovascular: Positive for leg swelling. Negative for chest pain and palpitations.  Gastrointestinal: Negative for nausea, vomiting, abdominal pain, diarrhea,  constipation, abdominal distention and rectal pain.  Skin: Positive for color change, rash and wound.  Neurological: Negative for dizziness, syncope, facial asymmetry, speech difficulty, weakness, light-headedness, numbness and headaches.  Hematological: Negative for adenopathy. Does not bruise/bleed easily.        Past Medical History  Diagnosis Date  . Diabetes mellitus   . Hypertension   . Hypercholesteremia     No past surgical history on file.  Prior to Admission medications   Medication Sig Start Date End Date Taking? Authorizing Provider  glucose blood (ONE TOUCH ULTRA TEST) test strip Use as instructed 03/31/12  Yes Ryan M Dunn, PA-C  lisinopril (PRINIVIL,ZESTRIL) 10 MG tablet Take 10 mg by mouth daily.   Yes Historical Provider, MD  metFORMIN (GLUCOPHAGE-XR) 500 MG 24 hr tablet Take 1 tablet (500 mg total) by mouth 2 (two) times daily. 03/13/12  Yes Collene Gobble, MD  NOVOLOG 100 UNIT/ML injection SEE ATTACHED DIRECTIONS 03/28/12  Yes Elvina Sidle, MD  pravastatin (PRAVACHOL) 40 MG tablet Take 40 mg by mouth daily.   Yes Historical Provider, MD  promethazine (PHENERGAN) 25 MG tablet Take 1 tablet (25 mg total) by mouth every 6 (six) hours as needed for nausea. 03/04/12 03/11/12  Vida Roller, MD    Allergies  Allergen Reactions  . Lantus (Insulin Glargine) Swelling    History   Social History  . Marital Status: Married    Spouse Name: N/A    Number of Children: N/A  . Years  of Education: N/A   Occupational History  . Not on file.   Social History Main Topics  . Smoking status: Former Smoker    Types: Cigarettes    Quit date: 03/02/2012  . Smokeless tobacco: Not on file  . Alcohol Use: No  . Drug Use: No  . Sexually Active: Not on file   Other Topics Concern  . Not on file   Social History Narrative  . No narrative on file    No family history on file.  Objective:   Physical Exam  Nursing note and vitals reviewed. Constitutional: He is oriented to  person, place, and time. He appears well-developed and well-nourished. No distress.  HENT:  Head: Normocephalic and atraumatic.  Eyes: Conjunctivae normal and EOM are normal. Pupils are equal, round, and reactive to light.  Neck: Normal range of motion. Neck supple. No thyromegaly present.  Cardiovascular: Normal rate, regular rhythm, normal heart sounds and intact distal pulses.   No murmur heard. Pulmonary/Chest: Effort normal and breath sounds normal. No respiratory distress. He has no wheezes. He has no rales.  Lymphadenopathy:    He has no cervical adenopathy.  Neurological: He is alert and oriented to person, place, and time. He has normal reflexes. No cranial nerve deficit. He exhibits normal muscle tone. Coordination normal.       MONOFILAMENT INTACT B FEET.  Skin: Skin is warm and dry. He is not diaphoretic. There is erythema.       R KNEE MEDIAL ASPECT: TWO LOCALIZED MACULOPAPULAR LESIONS WITH SURROUNDING ERYTHEMA, MILD INDURATION 1 CM DIAMETER.  NO STREAKING; NO FLUCTUANTS.  NO PUSTULES OR VESICLES.  +SWELLING SURROUNDING TWO LESIONS.  Psychiatric: He has a normal mood and affect. His behavior is normal. Judgment and thought content normal.          Results for orders placed in visit on 06/21/12  POCT URINALYSIS DIPSTICK      Component Value Range   Color, UA yellow     Clarity, UA clear     Glucose, UA neg     Bilirubin, UA neg     Ketones, UA 15     Spec Grav, UA >=1.030     Blood, UA neg     pH, UA 6.0     Protein, UA neg     Urobilinogen, UA 0.2     Nitrite, UA neg     Leukocytes, UA Negative    POCT UA - MICROSCOPIC ONLY      Component Value Range   WBC, Ur, HPF, POC 0-2     RBC, urine, microscopic 7-12     Bacteria, U Microscopic 1+     Mucus, UA pos     Epithelial cells, urine per micros 0-1     Crystals, Ur, HPF, POC neg'     Casts, Ur, LPF, POC neg     Yeast, UA neg    POCT CBC      Component Value Range   WBC 8.0  4.6 - 10.2 K/uL   Lymph, poc 2.8   0.6 - 3.4   POC LYMPH PERCENT 35.0  10 - 50 %L   MID (cbc) 0.7  0 - 0.9   POC MID % 8.2  0 - 12 %M   POC Granulocyte 4.5  2 - 6.9   Granulocyte percent 56.8  37 - 80 %G   RBC 4.88  4.69 - 6.13 M/uL   Hemoglobin 14.0 (*) 14.1 - 18.1 g/dL   HCT, POC  44.6  43.5 - 53.7 %   MCV 91.3  80 - 97 fL   MCH, POC 28.7  27 - 31.2 pg   MCHC 31.4 (*) 31.8 - 35.4 g/dL   RDW, POC 82.9     Platelet Count, POC 336  142 - 424 K/uL   MPV 8.4  0 - 99.8 fL  POCT GLYCOSYLATED HEMOGLOBIN (HGB A1C)      Component Value Range   Hemoglobin A1C 6.0      Assessment & Plan:   1. Type II or unspecified type diabetes mellitus without mention of complication, not stated as uncontrolled  POCT glycosylated hemoglobin (Hb A1C), Microalbumin, urine  2. Essential hypertension, benign  POCT urinalysis dipstick, POCT UA - Microscopic Only, TSH  3. Pure hypercholesterolemia  Comprehensive metabolic panel, Lipid panel, CK  4. Need for prophylactic vaccination and inoculation against influenza  Flu vaccine greater than or equal to 3yo preservative free IM  5. Insect bite of leg    6. Cellulitis and abscess of leg, except foot  POCT CBC, amoxicillin-clavulanate (AUGMENTIN) 875-125 MG per tablet  7. Pancreatitis history   1.  DMII: controlled; no change in medications at this time; if repeat HgbA1c in three months therapeutic, will start weaning insulin therapy and transitioning to oral therapy.  Monofilament intact.  Refills provided; obtain labs including urine microalbumin.  S/p flu vaccine. 2.  HTN: controlled; goal BP<130/80 due to DMII: refills provided; obtain u/a and labs. 3.  Hyperlipidemia: stable; obtain labs. Goal LDL<100 and close to 70 due to DMII.  Refills provided.   4.  Cellulitis leg: New.  Rx for Augmentin provided to treat secondary bacterial infection at site of insect bite. Mild infection at this time.  If worsens, will add Bactrim or Doxy.  RTC immediately for increasing redness, swelling, pain or  development of fever/chills/sweats/malaise. 5.  Insect bite with localized allergic reaction: New.  Recommend Zyrtec 10mg  daily; Benadryl 25mg  on po qhs.  Ice to bites for next 72 hours. 6. Pancreatitis:  Resolved; no recurrent symptoms with improved glycemic control and cessation of alcohol.  7. S/p influenza vaccine.

## 2012-06-21 NOTE — Patient Instructions (Addendum)
1. Type II or unspecified type diabetes mellitus without mention of complication, not stated as uncontrolled  POCT glycosylated hemoglobin (Hb A1C), Microalbumin, urine  2. Essential hypertension, benign  POCT urinalysis dipstick, POCT UA - Microscopic Only, TSH  3. Pure hypercholesterolemia  Comprehensive metabolic panel, Lipid panel, CK  4. Need for prophylactic vaccination and inoculation against influenza  Flu vaccine greater than or equal to 31yo preservative free IM  5. Insect bite of leg    6. Cellulitis and abscess of leg, except foot  POCT CBC, amoxicillin-clavulanate (AUGMENTIN) 875-125 MG per tablet    TAKE BENADRYL 25MG  ONE AT BEDTIME FOR NEXT WEEK FOR ALLERGIC REACTION. TAKE ZYRTEC 10MG  ONE DAILY FOR ALLERGIC REACTION. RETURN FOR FEVER, INCREASING SWELLING, INCREASE PAIN OF R KNEE.

## 2012-06-22 LAB — LIPID PANEL
Cholesterol: 112 mg/dL (ref 0–200)
LDL Cholesterol: 61 mg/dL (ref 0–99)
Total CHOL/HDL Ratio: 3.2 Ratio
Triglycerides: 81 mg/dL (ref <150)
VLDL: 16 mg/dL (ref 0–40)

## 2012-06-22 LAB — COMPREHENSIVE METABOLIC PANEL
ALT: 21 U/L (ref 0–53)
AST: 16 U/L (ref 0–37)
Albumin: 5 g/dL (ref 3.5–5.2)
Alkaline Phosphatase: 60 U/L (ref 39–117)
BUN: 17 mg/dL (ref 6–23)
Potassium: 4.4 mEq/L (ref 3.5–5.3)
Sodium: 140 mEq/L (ref 135–145)

## 2012-06-22 LAB — MICROALBUMIN, URINE: Microalb, Ur: 0.75 mg/dL (ref 0.00–1.89)

## 2012-06-30 NOTE — Progress Notes (Signed)
Reviewed and agree.

## 2012-10-18 IMAGING — CR DG CHEST 2V
2 series · 2 of 2 positions shown · non-contrast
Comparison: 03/10/2012.

CLINICAL DATA: Pneumonia.  Fatigue.

CHEST - 2 VIEW

[PA]
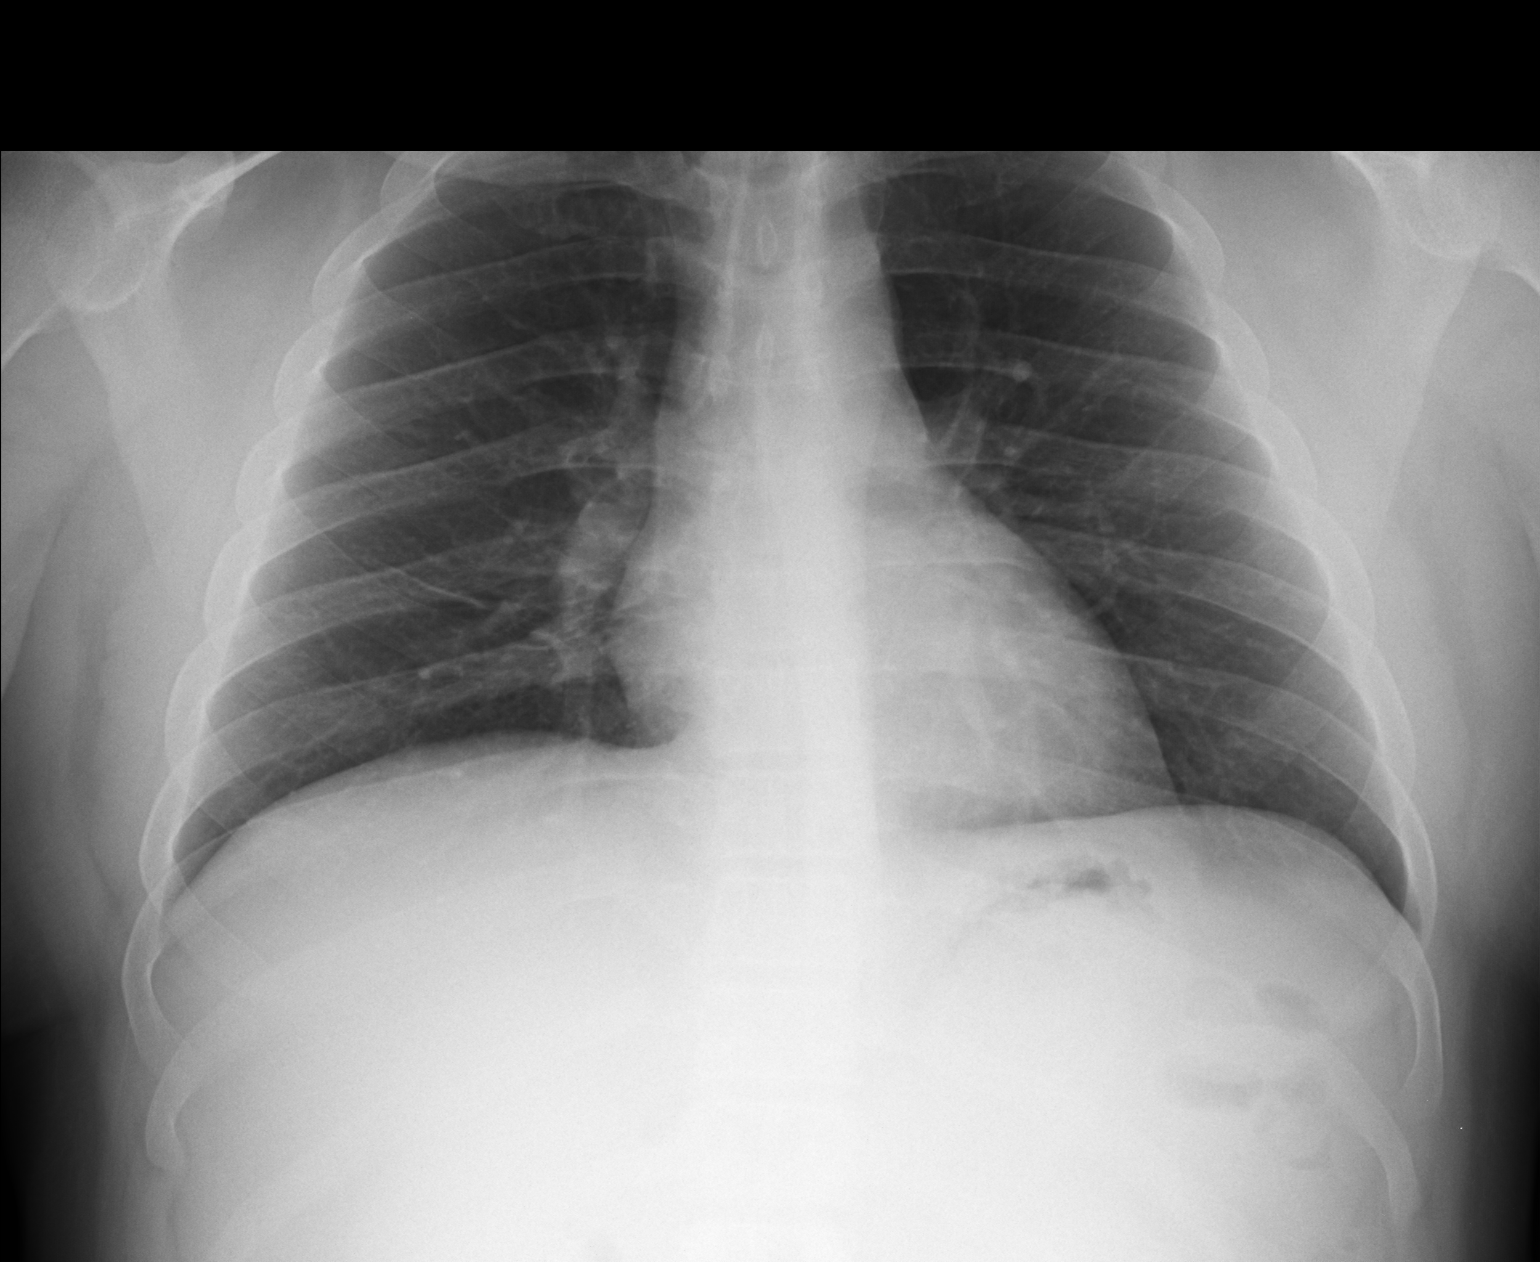

[lateral]
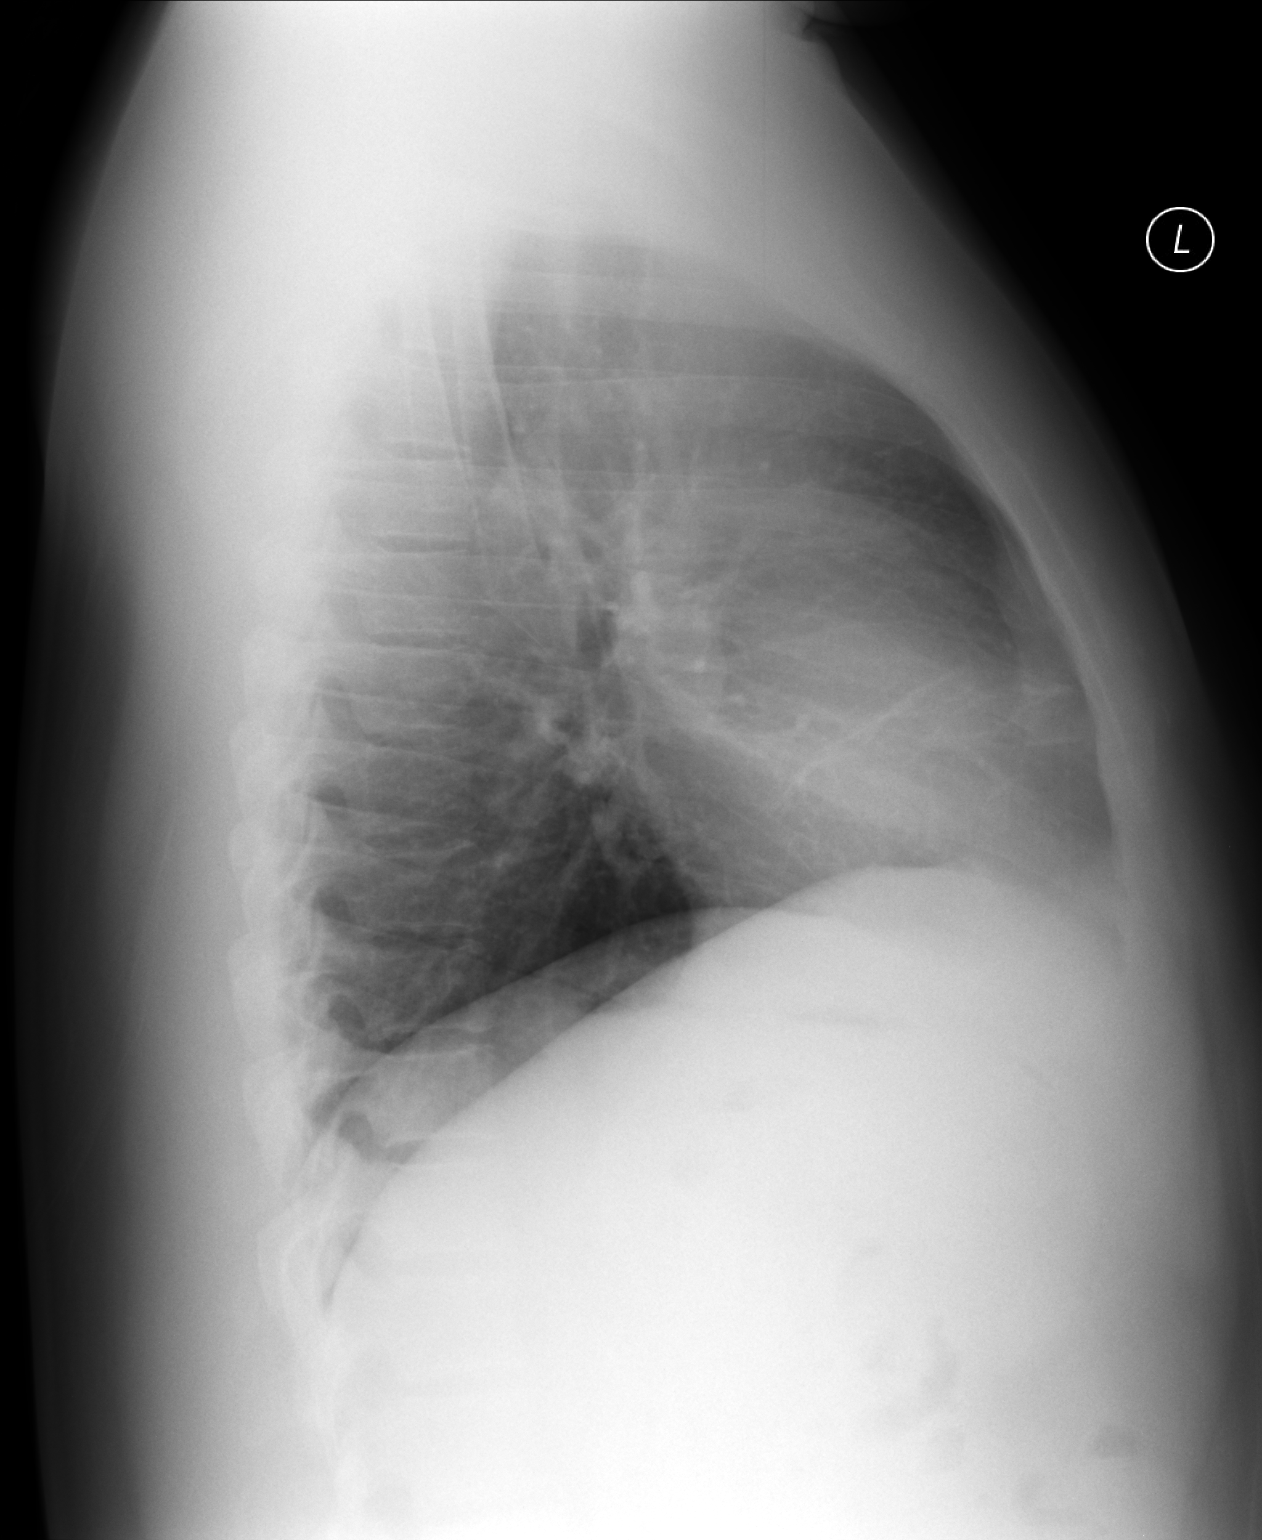

[2 of 2 positions shown; findings below may reference images not displayed]

FINDINGS: Cardiopericardial silhouette within normal limits.
Mediastinal contours normal. Trachea midline.  No airspace disease
or effusion.  Resolution of previously seen atelectasis.
IMPRESSION: Normal two-view chest.

Clinically significant discrepancy from primary report, if
provided: None

## 2013-03-23 ENCOUNTER — Ambulatory Visit (INDEPENDENT_AMBULATORY_CARE_PROVIDER_SITE_OTHER): Payer: BC Managed Care – PPO | Admitting: Emergency Medicine

## 2013-03-23 VITALS — BP 120/88 | HR 86 | Temp 97.7°F | Resp 16 | Ht 67.75 in | Wt 224.0 lb

## 2013-03-23 DIAGNOSIS — E119 Type 2 diabetes mellitus without complications: Secondary | ICD-10-CM

## 2013-03-23 DIAGNOSIS — J018 Other acute sinusitis: Secondary | ICD-10-CM

## 2013-03-23 DIAGNOSIS — E782 Mixed hyperlipidemia: Secondary | ICD-10-CM

## 2013-03-23 LAB — POCT URINALYSIS DIPSTICK
Blood, UA: NEGATIVE
Glucose, UA: NEGATIVE
Nitrite, UA: NEGATIVE
Protein, UA: NEGATIVE
Spec Grav, UA: 1.025
Urobilinogen, UA: 0.2
pH, UA: 6.5

## 2013-03-23 LAB — POCT CBC
MCH, POC: 30.3 pg (ref 27–31.2)
MCHC: 32.1 g/dL (ref 31.8–35.4)
MCV: 94.4 fL (ref 80–97)
MID (cbc): 0.4 (ref 0–0.9)
MPV: 8.8 fL (ref 0–99.8)
POC LYMPH PERCENT: 29 %L (ref 10–50)
POC MID %: 8.3 %M (ref 0–12)
Platelet Count, POC: 217 10*3/uL (ref 142–424)
RBC: 4.99 M/uL (ref 4.69–6.13)
RDW, POC: 13.7 %
WBC: 5.1 10*3/uL (ref 4.6–10.2)

## 2013-03-23 MED ORDER — GLUCOSE BLOOD VI STRP: ORAL_STRIP | Status: DC

## 2013-03-23 MED ORDER — PRAVASTATIN SODIUM 40 MG PO TABS: 40.0000 mg | ORAL_TABLET | Freq: Every day | ORAL | Status: DC

## 2013-03-23 MED ORDER — INSULIN ASPART 100 UNIT/ML ~~LOC~~ SOLN
SUBCUTANEOUS | Status: DC
Start: 2013-03-23 — End: 2014-05-14

## 2013-03-23 MED ORDER — LISINOPRIL 10 MG PO TABS: 10.0000 mg | ORAL_TABLET | Freq: Every day | ORAL | Status: DC

## 2013-03-23 MED ORDER — AMOXICILLIN-POT CLAVULANATE 875-125 MG PO TABS: 1.0000 | ORAL_TABLET | Freq: Two times a day (BID) | ORAL | Status: DC

## 2013-03-23 MED ORDER — METFORMIN HCL ER 500 MG PO TB24: 500.0000 mg | ORAL_TABLET | Freq: Two times a day (BID) | ORAL | Status: DC

## 2013-03-23 MED ORDER — PSEUDOEPHEDRINE-GUAIFENESIN ER 60-600 MG PO TB12: 1.0000 | ORAL_TABLET | Freq: Two times a day (BID) | ORAL | Status: DC

## 2013-03-23 NOTE — Patient Instructions (Addendum)
Type 2 Diabetes Mellitus, Adult Type 2 diabetes mellitus, often simply referred to as type 2 diabetes, is a long-lasting (chronic) disease. In type 2 diabetes, the pancreas does not make enough insulin (a hormone), the cells are less responsive to the insulin that is made (insulin resistance), or both. Normally, insulin moves sugars from food into the tissue cells. The tissue cells use the sugars for energy. The lack of insulin or the lack of normal response to insulin causes excess sugars to build up in the blood instead of going into the tissue cells. As a result, high blood sugar (hyperglycemia) develops. The effect of high sugar (glucose) levels can cause many complications. Type 2 diabetes was also previously called adult-onset diabetes but it can occur at any age.  RISK FACTORS  A person is predisposed to developing type 2 diabetes if someone in the family has the disease and also has one or more of the following primary risk factors:  Overweight.  An inactive lifestyle.  A history of consistently eating high-calorie foods. Maintaining a normal weight and regular physical activity can reduce the chance of developing type 2 diabetes. SYMPTOMS  A person with type 2 diabetes may not show symptoms initially. The symptoms of type 2 diabetes appear slowly. The symptoms include:  Increased thirst (polydipsia).  Increased urination (polyuria).  Increased urination during the night (nocturia).  Weight loss. This weight loss may be rapid.  Frequent, recurring infections.  Tiredness (fatigue).  Weakness.  Vision changes, such as blurred vision.  Fruity smell to your breath.  Abdominal pain.  Nausea or vomiting.  Cuts or bruises which are slow to heal.  Tingling or numbness in the hands or feet. DIAGNOSIS Type 2 diabetes is frequently not diagnosed until complications of diabetes are present. Type 2 diabetes is diagnosed when symptoms or complications are present and when blood  glucose levels are increased. Your blood glucose level may be checked by one or more of the following blood tests:  A fasting blood glucose test. You will not be allowed to eat for at least 8 hours before a blood sample is taken.  A random blood glucose test. Your blood glucose is checked at any time of the day regardless of when you ate.  A hemoglobin A1c blood glucose test. A hemoglobin A1c test provides information about blood glucose control over the previous 3 months.  An oral glucose tolerance test (OGTT). Your blood glucose is measured after you have not eaten (fasted) for 2 hours and then after you drink a glucose-containing beverage. TREATMENT   You may need to take insulin or diabetes medicine daily to keep blood glucose levels in the desired range.  You will need to match insulin dosing with exercise and healthy food choices. The treatment goal is to maintain the before meal blood sugar (preprandial glucose) level at 70 130 mg/dL. HOME CARE INSTRUCTIONS   Have your hemoglobin A1c level checked twice a year.  Perform daily blood glucose monitoring as directed by your caregiver.  Monitor urine ketones when you are ill and as directed by your caregiver.  Take your diabetes medicine or insulin as directed by your caregiver to maintain your blood glucose levels in the desired range.  Never run out of diabetes medicine or insulin. It is needed every day.  Adjust insulin based on your intake of carbohydrates. Carbohydrates can raise blood glucose levels but need to be included in your diet. Carbohydrates provide vitamins, minerals, and fiber which are an essential part of   a healthy diet. Carbohydrates are found in fruits, vegetables, whole grains, dairy products, legumes, and foods containing added sugars.    Eat healthy foods. Alternate 3 meals with 3 snacks.  Lose weight if overweight.  Carry a medical alert card or wear your medical alert jewelry.  Carry a 15 gram  carbohydrate snack with you at all times to treat low blood glucose (hypoglycemia). Some examples of 15 gram carbohydrate snacks include:  Glucose tablets, 3 or 4   Glucose gel, 15 gram tube  Raisins, 2 tablespoons (24 grams)  Jelly beans, 6  Animal crackers, 8  Regular pop, 4 ounces (120 mL)  Gummy treats, 9  Recognize hypoglycemia. Hypoglycemia occurs with blood glucose levels of 70 mg/dL and below. The risk for hypoglycemia increases when fasting or skipping meals, during or after intense exercise, and during sleep. Hypoglycemia symptoms can include:  Tremors or shakes.  Decreased ability to concentrate.  Sweating.  Increased heart rate.  Headache.  Dry mouth.  Hunger.  Irritability.  Anxiety.  Restless sleep.  Altered speech or coordination.  Confusion.  Treat hypoglycemia promptly. If you are alert and able to safely swallow, follow the 15:15 rule:  Take 15 20 grams of rapid-acting glucose or carbohydrate. Rapid-acting options include glucose gel, glucose tablets, or 4 ounces (120 mL) of fruit juice, regular soda, or low fat milk.  Check your blood glucose level 15 minutes after taking the glucose.  Take 15 20 grams more of glucose if the repeat blood glucose level is still 70 mg/dL or below.  Eat a meal or snack within 1 hour once blood glucose levels return to normal.    Be alert to polyuria and polydipsia which are early signs of hyperglycemia. An early awareness of hyperglycemia allows for prompt treatment. Treat hyperglycemia as directed by your caregiver.  Engage in at least 150 minutes of moderate-intensity physical activity a week, spread over at least 3 days of the week or as directed by your caregiver. In addition, you should engage in resistance exercise at least 2 times a week or as directed by your caregiver.  Adjust your medicine and food intake as needed if you start a new exercise or sport.  Follow your sick day plan at any time you  are unable to eat or drink as usual.  Avoid tobacco use.  Limit alcohol intake to no more than 1 drink per day for nonpregnant women and 2 drinks per day for men. You should drink alcohol only when you are also eating food. Talk with your caregiver whether alcohol is safe for you. Tell your caregiver if you drink alcohol several times a week.  Follow up with your caregiver regularly.  Schedule an eye exam soon after the diagnosis of type 2 diabetes and then annually.  Perform daily skin and foot care. Examine your skin and feet daily for cuts, bruises, redness, nail problems, bleeding, blisters, or sores. A foot exam by a caregiver should be done annually.  Brush your teeth and gums at least twice a day and floss at least once a day. Follow up with your dentist regularly.  Share your diabetes management plan with your workplace or school.  Stay up-to-date with immunizations.  Learn to manage stress.  Obtain ongoing diabetes education and support as needed.  Participate in, or seek rehabilitation as needed to maintain or improve independence and quality of life. Request a physical or occupational therapy referral if you are having foot or hand numbness or difficulties with grooming,   dressing, eating, or physical activity. SEEK MEDICAL CARE IF:   You are unable to eat food or drink fluids for more than 6 hours.  You have nausea and vomiting for more than 6 hours.  Your blood glucose level is over 240 mg/dL.  There is a change in mental status.  You develop an additional serious illness.  You have diarrhea for more than 6 hours.  You have been sick or have had a fever for a couple of days and are not getting better.  You have pain during any physical activity.  SEEK IMMEDIATE MEDICAL CARE IF:  You have difficulty breathing.  You have moderate to large ketone levels. MAKE SURE YOU:  Understand these instructions.  Will watch your condition.  Will get help right away if  you are not doing well or get worse. Document Released: 09/17/2005 Document Revised: 06/11/2012 Document Reviewed: 04/15/2012 [employer] Patient Information 2014 [employer].  

## 2013-03-23 NOTE — Progress Notes (Addendum)
[facility] [address] [place] [address] [phone]  Date:  03/23/2013   Name:  Lee Maldonado   DOB:  December 15, 1980   MRN:  [id number]  PCP:  Elvina Sidle, MD    Chief Complaint: Headache, Sinusitis, Cough and Medication Refill   History of Present Illness:  Casson Catena is a 32 y.o. very pleasant male patient who presents with the following:  History of NIDDM and hyperlipidemia.  Has run out of medications.  Now wants a refill.  Ran out 3 weeks ago.    Has nasal congestion with drainage and a cough productive of mucopurulent sputum.  Wheezing.  Some exertional shortness of breath.  Says severe frontal headache. No nausea or vomiting no stool change.  No neuro or visual symptoms.  No fever or chills. No improvement with over the counter medications or other home remedies. Denies other complaint or health concern today.   Patient Active Problem List   Diagnosis Date Noted  . Hypercholesteremia   . DM (diabetes mellitus) 11/27/2011    Past Medical History  Diagnosis Date  . Diabetes mellitus   . Hypertension   . Hypercholesteremia     No past surgical history on file.  History  Substance Use Topics  . Smoking status: Former Smoker    Types: Cigarettes    Quit date: 03/02/2012  . Smokeless tobacco: Not on file  . Alcohol Use: No    No family history on file.  Allergies  Allergen Reactions  . Lantus (Insulin Glargine) Swelling    Medication list has been reviewed and updated.  Current Outpatient Prescriptions on File Prior to Visit  Medication Sig Dispense Refill  . glucose blood (ONE TOUCH ULTRA TEST) test strip Use as instructed  50 each  2  . insulin aspart (NOVOLOG) 100 UNIT/ML injection Sliding scale insulin  10 mL  3  . lisinopril (PRINIVIL,ZESTRIL) 10 MG tablet Take 1 tablet (10 mg total) by mouth daily.  30 tablet  5  . metFORMIN (GLUCOPHAGE-XR) 500 MG 24 hr tablet Take 1 tablet (500 mg total) by mouth 2 (two) times daily.  60  tablet  11  . pravastatin (PRAVACHOL) 40 MG tablet Take 1 tablet (40 mg total) by mouth daily.  30 tablet  5  . promethazine (PHENERGAN) 25 MG tablet Take 1 tablet (25 mg total) by mouth every 6 (six) hours as needed for nausea.  12 tablet  0   No current facility-administered medications on file prior to visit.    Review of Systems:  As per HPI, otherwise negative.    Physical Examination: Filed Vitals:   03/23/13 1253  BP: 120/88  Pulse: 86  Temp: 97.7 F (36.5 C)  Resp: 16   Filed Vitals:   03/23/13 1253  Height: 5' 7.75" (1.721 m)  Weight: 224 lb (101.606 kg)   Body mass index is 34.31 kg/(m^2). Ideal Body Weight: Weight in (lb) to have BMI = 25: 162.9  GEN: WDWN, NAD, Non-toxic, A & O x 3 HEENT: Atraumatic, Normocephalic. Neck supple. No masses, No LAD. Ears and Nose: No external deformity. CV: RRR, No M/G/R. No JVD. No thrill. No extra heart sounds. PULM: CTA B, no wheezes, crackles, rhonchi. No retractions. No resp. distress. No accessory muscle use. ABD: S, NT, ND, +BS. No rebound. No HSM. EXTR: No c/c/e NEURO Normal gait.  PSYCH: Normally interactive. Conversant. Not depressed or anxious appearing.  Calm demeanor.    Assessment and Plan: NIDDM Hyperlipidemia Obesity Non  compliance Sinusitis Refill meds for one month and follow up with DR Daub in 104  Signed,  Phillips Odor, MD   Results for orders placed in visit on 03/23/13  POCT GLYCOSYLATED HEMOGLOBIN (HGB A1C)      Result Value Range   Hemoglobin A1C 6.8    POCT CBC      Result Value Range   WBC 5.1  4.6 - 10.2 K/uL   Lymph, poc 1.5  0.6 - 3.4   POC LYMPH PERCENT 29.0  10 - 50 %L   MID (cbc) 0.4  0 - 0.9   POC MID % 8.3  0 - 12 %M   POC Granulocyte 3.2  2 - 6.9   Granulocyte percent 62.7  37 - 80 %G   RBC 4.99  4.69 - 6.13 M/uL   Hemoglobin 15.1  14.1 - 18.1 g/dL   HCT, POC 65.7  84.6 - 53.7 %   MCV 94.4  80 - 97 fL   MCH, POC 30.3  27 - 31.2 pg   MCHC 32.1  31.8 - 35.4 g/dL    RDW, POC 96.2     Platelet Count, POC 217  142 - 424 K/uL   MPV 8.8  0 - 99.8 fL  POCT URINALYSIS DIPSTICK      Result Value Range   Color, UA yellow     Clarity, UA clear     Glucose, UA neg     Bilirubin, UA neg     Ketones, UA neg     Spec Grav, UA 1.025     Blood, UA neg     pH, UA 6.5     Protein, UA neg     Urobilinogen, UA 0.2     Nitrite, UA neg     Leukocytes, UA Negative     STOP INSULIN UNTIL HE SEES DR Cleta Alberts

## 2013-03-24 LAB — COMPREHENSIVE METABOLIC PANEL
AST: 19 U/L (ref 0–37)
Albumin: 4.6 g/dL (ref 3.5–5.2)
Alkaline Phosphatase: 65 U/L (ref 39–117)
BUN: 9 mg/dL (ref 6–23)
Creat: 0.82 mg/dL (ref 0.50–1.35)
Glucose, Bld: 128 mg/dL — ABNORMAL HIGH (ref 70–99)
Total Bilirubin: 0.4 mg/dL (ref 0.3–1.2)

## 2013-03-24 LAB — LIPID PANEL
HDL: 42 mg/dL (ref >39)
LDL Cholesterol: 92 mg/dL (ref 0–99)
Total CHOL/HDL Ratio: 3.6 Ratio
Triglycerides: 94 mg/dL (ref <150)

## 2013-07-29 ENCOUNTER — Ambulatory Visit: Payer: BC Managed Care – PPO

## 2013-07-29 ENCOUNTER — Ambulatory Visit (INDEPENDENT_AMBULATORY_CARE_PROVIDER_SITE_OTHER): Payer: BC Managed Care – PPO | Admitting: Family Medicine

## 2013-07-29 VITALS — BP 130/82 | HR 84 | Temp 98.0°F | Resp 16 | Ht 68.0 in | Wt 234.0 lb

## 2013-07-29 DIAGNOSIS — E119 Type 2 diabetes mellitus without complications: Secondary | ICD-10-CM

## 2013-07-29 DIAGNOSIS — R05 Cough: Secondary | ICD-10-CM

## 2013-07-29 DIAGNOSIS — Z79899 Other long term (current) drug therapy: Secondary | ICD-10-CM

## 2013-07-29 DIAGNOSIS — F17200 Nicotine dependence, unspecified, uncomplicated: Secondary | ICD-10-CM

## 2013-07-29 DIAGNOSIS — R0981 Nasal congestion: Secondary | ICD-10-CM

## 2013-07-29 DIAGNOSIS — J3489 Other specified disorders of nose and nasal sinuses: Secondary | ICD-10-CM

## 2013-07-29 DIAGNOSIS — R059 Cough, unspecified: Secondary | ICD-10-CM

## 2013-07-29 DIAGNOSIS — M62830 Muscle spasm of back: Secondary | ICD-10-CM

## 2013-07-29 DIAGNOSIS — M545 Low back pain, unspecified: Secondary | ICD-10-CM

## 2013-07-29 DIAGNOSIS — E7800 Pure hypercholesterolemia, unspecified: Secondary | ICD-10-CM

## 2013-07-29 LAB — POCT CBC
Granulocyte percent: 59.8 %G (ref 37–80)
HCT, POC: 43.9 % (ref 43.5–53.7)
Lymph, poc: 2.1 (ref 0.6–3.4)
MCHC: 31.7 g/dL — AB (ref 31.8–35.4)
MID (cbc): 0.7 (ref 0–0.9)
MPV: 8.9 fL (ref 0–99.8)
POC Granulocyte: 4.2 (ref 2–6.9)
POC LYMPH PERCENT: 30.6 %L (ref 10–50)
POC MID %: 9.6 %M (ref 0–12)
Platelet Count, POC: 260 10*3/uL (ref 142–424)
RDW, POC: 13.7 %

## 2013-07-29 LAB — POCT URINALYSIS DIPSTICK
Blood, UA: NEGATIVE
Glucose, UA: NEGATIVE
Spec Grav, UA: >=1.03
Urobilinogen, UA: 0.2
pH, UA: 6

## 2013-07-29 LAB — POCT GLYCOSYLATED HEMOGLOBIN (HGB A1C): Hemoglobin A1C: 7.3

## 2013-07-29 LAB — POCT UA - MICROSCOPIC ONLY: Casts, Ur, LPF, POC: NEGATIVE

## 2013-07-29 LAB — GLUCOSE, POCT (MANUAL RESULT ENTRY): POC Glucose: 166 mg/dl — AB (ref 70–99)

## 2013-07-29 MED ORDER — IPRATROPIUM BROMIDE 0.03 % NA SOLN: 2.0000 | Freq: Four times a day (QID) | NASAL | Status: DC

## 2013-07-29 MED ORDER — HYDROCODONE-HOMATROPINE 5-1.5 MG/5ML PO SYRP: 5.0000 mL | ORAL_SOLUTION | Freq: Three times a day (TID) | ORAL | Status: DC | PRN

## 2013-07-29 MED ORDER — MELOXICAM 15 MG PO TABS: 15.0000 mg | ORAL_TABLET | Freq: Every day | ORAL | Status: DC

## 2013-07-29 MED ORDER — CYCLOBENZAPRINE HCL 10 MG PO TABS: 10.0000 mg | ORAL_TABLET | Freq: Three times a day (TID) | ORAL | Status: DC | PRN

## 2013-07-29 MED ORDER — FLUTICASONE PROPIONATE 50 MCG/ACT NA SUSP: 2.0000 | Freq: Every day | NASAL | Status: DC

## 2013-07-29 MED ORDER — AZITHROMYCIN 250 MG PO TABS: ORAL_TABLET | ORAL | Status: DC

## 2013-07-29 MED ORDER — METFORMIN HCL ER 500 MG PO TB24: 500.0000 mg | ORAL_TABLET | Freq: Two times a day (BID) | ORAL | Status: DC

## 2013-07-29 MED ORDER — PRAVASTATIN SODIUM 40 MG PO TABS: 40.0000 mg | ORAL_TABLET | Freq: Every day | ORAL | Status: DC

## 2013-07-29 MED ORDER — IPRATROPIUM-ALBUTEROL 20-100 MCG/ACT IN AERS: 1.0000 | INHALATION_SPRAY | Freq: Four times a day (QID) | RESPIRATORY_TRACT | Status: DC

## 2013-07-29 MED ORDER — LISINOPRIL 10 MG PO TABS: 10.0000 mg | ORAL_TABLET | Freq: Every day | ORAL | Status: DC

## 2013-07-29 NOTE — Progress Notes (Signed)
This chart was scribed for Norberto Sorenson, MD by Joaquin Music, ED Scribe. This patient was seen in room Room 1 and the patient's care was started at 7:28 PM  Subjective:    Patient ID: Lee Maldonado, male    DOB: [birth year], 32 y.o.   MRN: [id number] Chief Complaint  Patient presents with  . Back Pain    x week  . Cough       . Medication Refill   Back Pain Associated symptoms include abdominal pain and headaches. Pertinent negatives include no chest pain, fever, numbness or weakness.  Cough Associated symptoms include headaches, myalgias, rhinorrhea and shortness of breath. Pertinent negatives include no chest pain, chills, ear pain, fever or sore throat.   Lee Maldonado is a 32 y.o. male who presents to the [facility] complaining of ongoing productive cough with yellow sputum onset 2 days. Pt states he has SOB, chest tightness, and abd pain when he coughs. Pt states he also has nasal congestion onset 2 days. Pt denies having any blood from nares. Pt states he has been having HA due to sinus pressure. Pt states he has been eating and drinking normal.  Pt states he is a smoker. Quit once cold [place] but recently restarted. Plans to quit again.  He states he has been checking his cbgs daily though less recently. He states cbgs have been normal, below 150.  Pt also complains of lower bilateral back pain onset a few weeks. He states he carries a heavy ladder while at work which he thinks has caused the pain. He has been taking OTC Advil and Tylenol without relief. Pt denies pain that radiates to lower extremities. Pt denies weakness and numbness. Pt denies bowel and bladder incontinence.   Pt is present for rx refill of his statin, acei, and metformin. Denies any med side effects. Pt states he has ran out of his medications sev wks ago.    Past Medical History  Diagnosis Date  . Diabetes mellitus   . Hypertension   . Hypercholesteremia    Current Outpatient Prescriptions on File Prior to  Visit  Medication Sig Dispense Refill  . insulin aspart (NOVOLOG) 100 UNIT/ML injection Sliding scale insulin  10 mL  3  . lisinopril (PRINIVIL,ZESTRIL) 10 MG tablet Take 1 tablet (10 mg total) by mouth daily.  30 tablet  1  . metFORMIN (GLUCOPHAGE-XR) 500 MG 24 hr tablet Take 1 tablet (500 mg total) by mouth 2 (two) times daily.  60 tablet  1  . pravastatin (PRAVACHOL) 40 MG tablet Take 1 tablet (40 mg total) by mouth daily.  30 tablet  1  . glucose blood (ONE TOUCH ULTRA TEST) test strip Use as instructed  50 each  12  . promethazine (PHENERGAN) 25 MG tablet Take 1 tablet (25 mg total) by mouth every 6 (six) hours as needed for nausea.  12 tablet  0   No current facility-administered medications on file prior to visit.   Allergies  Allergen Reactions  . Lantus [Insulin Glargine] Swelling    Review of Systems  Constitutional: Negative for fever, chills, activity change and appetite change.  HENT: Positive for congestion (Nasal), rhinorrhea and sinus pressure. Negative for ear discharge, ear pain, nosebleeds, sore throat and trouble swallowing.   Respiratory: Positive for cough, chest tightness and shortness of breath.   Cardiovascular: Negative for chest pain and palpitations.  Gastrointestinal: Positive for abdominal pain. Negative for nausea, vomiting, diarrhea and constipation.  Genitourinary: Negative for urgency, frequency, decreased urine  volume and difficulty urinating.  Musculoskeletal: Positive for back pain and myalgias. Negative for gait problem.  Skin: Negative for color change and wound.  Neurological: Positive for headaches. Negative for dizziness, weakness, light-headedness and numbness.   Triage Vitals:BP 130/82  Pulse 84  Temp(Src) 98 F (36.7 C) (Oral)  Resp 16  Ht 5\' 8"  (1.727 m)  Wt 234 lb (106.142 kg)  BMI 35.59 kg/m2  SpO2 98% Objective:   Physical Exam  Nursing note and vitals reviewed. Constitutional: He is oriented to person, place, and time. He  appears well-developed and well-nourished. No distress.  HENT:  Head: Normocephalic and atraumatic.  Right Ear: External ear and ear canal normal. Tympanic membrane is erythematous.  Left Ear: External ear and ear canal normal. Tympanic membrane is erythematous.  Nose: Mucosal edema and rhinorrhea present.  Mouth/Throat: Uvula is midline and mucous membranes are normal. No trismus in the jaw. No uvula swelling. Posterior oropharyngeal erythema present. No oropharyngeal exudate or posterior oropharyngeal edema.   1+ tonsils bilaterally.   Eyes: Conjunctivae are normal. Pupils are equal, round, and reactive to light.  Neck: Normal range of motion. Neck supple. No tracheal deviation present. No thyromegaly present.  Cardiovascular: Normal rate and regular rhythm.   No murmur heard. Pulmonary/Chest: Effort normal. No stridor. He has no wheezes. He has rhonchi.  End-inspiratory rhonchi throughout all lung fields  Abdominal: Bowel sounds are normal. He exhibits no mass. There is no rebound and no guarding.  Musculoskeletal: Normal range of motion. He exhibits no tenderness.  Lymphadenopathy:       Head (right side): Tonsillar adenopathy present. No submandibular, no preauricular and no posterior auricular adenopathy present.       Head (left side): Tonsillar adenopathy present. No submandibular, no preauricular and no posterior auricular adenopathy present.    He has cervical adenopathy.       Right cervical: Superficial cervical adenopathy present. No posterior cervical adenopathy present.      Left cervical: Superficial cervical adenopathy present. No posterior cervical adenopathy present.       Right: No supraclavicular adenopathy present.       Left: No supraclavicular adenopathy present.  Neurological: He is alert and oriented to person, place, and time. He has normal reflexes.  Skin: Skin is warm and dry. He is not diaphoretic.  Psychiatric: He has a normal mood and affect. His behavior is  normal.   CXR: no acute abnormality    Results for orders placed in visit on 07/29/13  POCT CBC      Result Value Range   WBC 7.0  4.6 - 10.2 K/uL   Lymph, poc 2.1  0.6 - 3.4   POC LYMPH PERCENT 30.6  10 - 50 %L   MID (cbc) 0.7  0 - 0.9   POC MID % 9.6  0 - 12 %M   POC Granulocyte 4.2  2 - 6.9   Granulocyte percent 59.8  37 - 80 %G   RBC 4.65 (*) 4.69 - 6.13 M/uL   Hemoglobin 13.9 (*) 14.1 - 18.1 g/dL   HCT, POC 16.1  09.6 - 53.7 %   MCV 94.5  80 - 97 fL   MCH, POC 29.9  27 - 31.2 pg   MCHC 31.7 (*) 31.8 - 35.4 g/dL   RDW, POC 04.5     Platelet Count, POC 260  142 - 424 K/uL   MPV 8.9  0 - 99.8 fL  GLUCOSE, POCT (MANUAL RESULT ENTRY)  Result Value Range   POC Glucose 166 (*) 70 - 99 mg/dl  POCT GLYCOSYLATED HEMOGLOBIN (HGB A1C)      Result Value Range   Hemoglobin A1C 7.3    POCT URINALYSIS DIPSTICK      Result Value Range   Color, UA yellow     Clarity, UA clear     Glucose, UA neg     Bilirubin, UA neg     Ketones, UA tr     Spec Grav, UA >=1.030     Blood, UA neg     pH, UA 6.0     Protein, UA trace     Urobilinogen, UA 0.2     Nitrite, UA neg     Leukocytes, UA Negative    POCT UA - MICROSCOPIC ONLY      Result Value Range   WBC, Ur, HPF, POC 0-2     RBC, urine, microscopic 0-2     Bacteria, U Microscopic small     Mucus, UA small     Epithelial cells, urine per micros 1-2     Crystals, Ur, HPF, POC calcium oxalate     Casts, Ur, LPF, POC neg     Yeast, UA neg     Assessment & Plan:   Cough - Plan: POCT CBC, DG Chest 2 View  Hypercholesteremia - last lipid panel at goal, recheck at f/u.  DM (diabetes mellitus) - Plan: POCT glucose (manual entry), POCT glycosylated hemoglobin (Hb A1C), TSH, Comprehensive metabolic panel, Microalbumin, urine - a1c above goal but likely due to out of meds x 3 wks Tobacco use disorder  Acute low back pain - Plan: POCT urinalysis dipstick, POCT UA - Microscopic Only  Sinus congestion - no signs of bacterial  infection on exam - rec symptomatic care but gave snap rx for zpack in case he worsens with f/c/purulent drainage/etc.  Encounter for long-term (current) use of other medications - importance of f/u BEFORE he runs out of medication so check for side effects and effectiveness reinforced.  Muscle spasm of back - daily mobic w/ prn flexeril, heat, stretch.  Diabetes mellitus - Plan: metFORMIN (GLUCOPHAGE-XR) 500 MG 24 hr tablet  Meds ordered this encounter  Medications  . cyclobenzaprine (FLEXERIL) 10 MG tablet    Sig: Take 1 tablet (10 mg total) by mouth 3 (three) times daily as needed for muscle spasms.    Dispense:  30 tablet    Refill:  0  . meloxicam (MOBIC) 15 MG tablet    Sig: Take 1 tablet (15 mg total) by mouth daily.    Dispense:  30 tablet    Refill:  0  . ipratropium (ATROVENT) 0.03 % nasal spray    Sig: Place 2 sprays into the nose 4 (four) times daily.    Dispense:  30 mL    Refill:  1  . fluticasone (FLONASE) 50 MCG/ACT nasal spray    Sig: Place 2 sprays into the nose at bedtime.    Dispense:  16 g    Refill:  2  . azithromycin (ZITHROMAX) 250 MG tablet    Sig: Take 2 tabs PO x 1 dose, then 1 tab PO QD x 4 days    Dispense:  6 tablet    Refill:  0  . Ipratropium-Albuterol (COMBIVENT RESPIMAT) 20-100 MCG/ACT AERS respimat    Sig: Inhale 1 puff into the lungs every 6 (six) hours.    Dispense:  4 g    Refill:  1  .  HYDROcodone-homatropine (HYCODAN) 5-1.5 MG/5ML syrup    Sig: Take 5 mLs by mouth every 8 (eight) hours as needed for cough.    Dispense:  120 mL    Refill:  0  . lisinopril (PRINIVIL,ZESTRIL) 10 MG tablet    Sig: Take 1 tablet (10 mg total) by mouth daily.    Dispense:  90 tablet    Refill:  1  . pravastatin (PRAVACHOL) 40 MG tablet    Sig: Take 1 tablet (40 mg total) by mouth daily.    Dispense:  90 tablet    Refill:  1  . metFORMIN (GLUCOPHAGE-XR) 500 MG 24 hr tablet    Sig: Take 1 tablet (500 mg total) by mouth 2 (two) times daily.    Dispense:   180 tablet    Refill:  1    I personally performed the services described in this documentation, which was scribed in my presence. The recorded information has been reviewed and considered, and addended by me as needed.  Norberto Sorenson, MD MPH

## 2013-07-29 NOTE — Patient Instructions (Signed)
I recommend keeping meloxicam on board - esp taking before work. Then when you come home, take a muscle relaxant cyclobenzaprine followed by 15 minutes of heat followed by gentle stretching. Try to do this regiment of heat and gentle stretching 2 - 3 times a day.  If you are still having pain in 4 to 6 wks, come back to clinic for further eval. RTC immed if symptoms worsen or you develop any other concerning symptoms.  Hot showers or breathing in steam may help loosen the congestion.  Using a netti pot or sinus rinse is also likely to help you feel better and keep this from progressing.  Use the atrovent nasal spray as needed throughout the day and use the fluticasone nasal spray every night before bed for at least 2 weeks.  I recommend augmenting with 12 hr sudafed (behind the counter) and generic mucinex to help you move out the congestion.  If no improvement or you are getting worse, come back as you might need a course of steroids and/or antibiotics but hopefully with all of the above, you can avoid it.

## 2013-07-30 LAB — COMPREHENSIVE METABOLIC PANEL
Alkaline Phosphatase: 60 U/L (ref 39–117)
CO2: 26 mEq/L (ref 19–32)
Creat: 0.86 mg/dL (ref 0.50–1.35)
Glucose, Bld: 145 mg/dL — ABNORMAL HIGH (ref 70–99)
Total Bilirubin: 0.3 mg/dL (ref 0.3–1.2)

## 2013-07-30 LAB — MICROALBUMIN, URINE: Microalb, Ur: 2.18 mg/dL — ABNORMAL HIGH (ref 0.00–1.89)

## 2013-07-30 LAB — TSH: TSH: 0.703 u[IU]/mL (ref 0.350–4.500)

## 2013-08-20 ENCOUNTER — Encounter: Payer: Self-pay | Admitting: Family Medicine

## 2013-12-12 ENCOUNTER — Emergency Department (HOSPITAL_COMMUNITY)
Admission: EM | Admit: 2013-12-12 | Discharge: 2013-12-12 | Disposition: A | Discharge status: Home / Self Care | Payer: 59 | Attending: Emergency Medicine | Admitting: Emergency Medicine

## 2013-12-12 ENCOUNTER — Encounter (HOSPITAL_COMMUNITY): Payer: Self-pay | Admitting: Emergency Medicine

## 2013-12-12 DIAGNOSIS — I10 Essential (primary) hypertension: Secondary | ICD-10-CM | POA: Insufficient documentation

## 2013-12-12 DIAGNOSIS — IMO0002 Reserved for concepts with insufficient information to code with codable children: Secondary | ICD-10-CM | POA: Insufficient documentation

## 2013-12-12 DIAGNOSIS — E7800 Pure hypercholesterolemia, unspecified: Secondary | ICD-10-CM | POA: Insufficient documentation

## 2013-12-12 DIAGNOSIS — M62838 Other muscle spasm: Secondary | ICD-10-CM

## 2013-12-12 DIAGNOSIS — Z87891 Personal history of nicotine dependence: Secondary | ICD-10-CM | POA: Insufficient documentation

## 2013-12-12 DIAGNOSIS — Y92410 Unspecified street and highway as the place of occurrence of the external cause: Secondary | ICD-10-CM | POA: Insufficient documentation

## 2013-12-12 DIAGNOSIS — Y9389 Activity, other specified: Secondary | ICD-10-CM | POA: Insufficient documentation

## 2013-12-12 DIAGNOSIS — S199XXA Unspecified injury of neck, initial encounter: Secondary | ICD-10-CM

## 2013-12-12 DIAGNOSIS — Z794 Long term (current) use of insulin: Secondary | ICD-10-CM | POA: Insufficient documentation

## 2013-12-12 DIAGNOSIS — M549 Dorsalgia, unspecified: Secondary | ICD-10-CM

## 2013-12-12 DIAGNOSIS — Z791 Long term (current) use of non-steroidal anti-inflammatories (NSAID): Secondary | ICD-10-CM | POA: Insufficient documentation

## 2013-12-12 DIAGNOSIS — S0993XA Unspecified injury of face, initial encounter: Secondary | ICD-10-CM | POA: Insufficient documentation

## 2013-12-12 DIAGNOSIS — Z8719 Personal history of other diseases of the digestive system: Secondary | ICD-10-CM | POA: Insufficient documentation

## 2013-12-12 DIAGNOSIS — E119 Type 2 diabetes mellitus without complications: Secondary | ICD-10-CM | POA: Insufficient documentation

## 2013-12-12 DIAGNOSIS — S0990XA Unspecified injury of head, initial encounter: Secondary | ICD-10-CM | POA: Insufficient documentation

## 2013-12-12 MED ORDER — MELOXICAM 7.5 MG PO TABS: 15.0000 mg | ORAL_TABLET | Freq: Every day | ORAL | Status: DC

## 2013-12-12 MED ORDER — METHOCARBAMOL 500 MG PO TABS
500.0000 mg | ORAL_TABLET | Freq: Once | ORAL | Status: AC
  Administered 2013-12-12: 500 mg via ORAL
  Filled 2013-12-12: qty 1

## 2013-12-12 MED ORDER — IBUPROFEN 400 MG PO TABS
800.0000 mg | ORAL_TABLET | Freq: Once | ORAL | Status: AC
  Administered 2013-12-12: 800 mg via ORAL
  Filled 2013-12-12: qty 2

## 2013-12-12 MED ORDER — METHOCARBAMOL 500 MG PO TABS: 500.0000 mg | ORAL_TABLET | Freq: Two times a day (BID) | ORAL | Status: DC
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
## 2013-12-15 NOTE — ED Provider Notes (Signed)
Medical screening examination/treatment/procedure(s) were performed by non-physician practitioner and as supervising physician I was immediately available for consultation/collaboration.   EKG Interpretation None       Zsazsa Bahena, MD 12/15/13 2217 

## 2014-02-07 ENCOUNTER — Encounter (HOSPITAL_COMMUNITY): Payer: Self-pay | Admitting: Emergency Medicine

## 2014-02-07 ENCOUNTER — Emergency Department (HOSPITAL_COMMUNITY)
Admission: EM | Admit: 2014-02-07 | Discharge: 2014-02-07 | Disposition: A | Discharge status: Home / Self Care | Payer: 59 | Attending: Emergency Medicine | Admitting: Emergency Medicine

## 2014-02-07 DIAGNOSIS — E119 Type 2 diabetes mellitus without complications: Secondary | ICD-10-CM | POA: Insufficient documentation

## 2014-02-07 DIAGNOSIS — J3489 Other specified disorders of nose and nasal sinuses: Secondary | ICD-10-CM | POA: Insufficient documentation

## 2014-02-07 DIAGNOSIS — Z791 Long term (current) use of non-steroidal anti-inflammatories (NSAID): Secondary | ICD-10-CM | POA: Insufficient documentation

## 2014-02-07 DIAGNOSIS — Z794 Long term (current) use of insulin: Secondary | ICD-10-CM | POA: Insufficient documentation

## 2014-02-07 DIAGNOSIS — H9190 Unspecified hearing loss, unspecified ear: Secondary | ICD-10-CM | POA: Insufficient documentation

## 2014-02-07 DIAGNOSIS — Z79899 Other long term (current) drug therapy: Secondary | ICD-10-CM | POA: Insufficient documentation

## 2014-02-07 DIAGNOSIS — Z87891 Personal history of nicotine dependence: Secondary | ICD-10-CM | POA: Insufficient documentation

## 2014-02-07 DIAGNOSIS — H6690 Otitis media, unspecified, unspecified ear: Secondary | ICD-10-CM | POA: Insufficient documentation

## 2014-02-07 DIAGNOSIS — I10 Essential (primary) hypertension: Secondary | ICD-10-CM | POA: Insufficient documentation

## 2014-02-07 MED ORDER — AMOXICILLIN-POT CLAVULANATE 875-125 MG PO TABS: 1.0000 | ORAL_TABLET | Freq: Two times a day (BID) | ORAL | Status: DC

## 2014-02-07 MED ORDER — ANTIPYRINE-BENZOCAINE 5.4-1.4 % OT SOLN
3.0000 [drp] | Freq: Once | OTIC | Status: AC
  Administered 2014-02-07: 4 [drp] via OTIC
  Filled 2014-02-07: qty 10

## 2014-02-07 NOTE — ED Provider Notes (Signed)
CSN: [id number]     Arrival date & time 02/07/14  1456 History  This chart was scribed for non-physician practitioner, Antony Madura, PA-C working with Dagmar Hait, MD by Greggory Stallion, ED scribe. This patient was seen in room TR08C/TR08C and the patient's care was started at 3:28 PM.   Chief Complaint  Patient presents with  . Otalgia   The history is provided by the patient. No language interpreter was used.   HPI Comments: Lee Maldonado is a 33 y.o. male who presents to the Emergency Department complaining of gradual onset bilateral ear pain that started 2 weeks ago. He states his ears feels like they are clogged up. States he has congestion but it is resolved now. Pt is also having mild hearing loss, right worse than left, and rhinorrhea. Pt sees Dr. Elvina Sidle at [facility] regularly but has not had time to go see him for this problem. He has taken allergy medications for his sinus problems with some relief. Denies fever, ear drainage, sore throat, trouble swallowing, inability to swallow.    Past Medical History  Diagnosis Date  . Diabetes mellitus   . Hypertension   . Hypercholesteremia    History reviewed. No pertinent past surgical history. Family History  Problem Relation Age of Onset  . Diabetes Mother   . Diabetes Father    History  Substance Use Topics  . Smoking status: Former Smoker    Types: Cigarettes    Quit date: 03/02/2012  . Smokeless tobacco: Not on file  . Alcohol Use: No    Review of Systems  Constitutional: Negative for fever.  HENT: Positive for ear pain, hearing loss and rhinorrhea. Negative for congestion, ear discharge, sore throat and trouble swallowing.   All other systems reviewed and are negative.  Allergies  Lantus  Home Medications   Prior to Admission medications   Medication Sig Start Date End Date Taking? Authorizing Provider  azithromycin (ZITHROMAX) 250 MG tablet Take 2 tabs PO x 1 dose, then 1 tab PO QD x 4 days 07/29/13    Sherren Mocha, MD  cyclobenzaprine (FLEXERIL) 10 MG tablet Take 1 tablet (10 mg total) by mouth 3 (three) times daily as needed for muscle spasms. 07/29/13   Sherren Mocha, MD  fluticasone (FLONASE) 50 MCG/ACT nasal spray Place 2 sprays into the nose at bedtime. 07/29/13   Sherren Mocha, MD  glucose blood (ONE TOUCH ULTRA TEST) test strip Use as instructed 03/23/13   Phillips Odor, MD  HYDROcodone-homatropine [facility]) 5-1.5 MG/5ML syrup Take 5 mLs by mouth every 8 (eight) hours as needed for cough. 07/29/13   Sherren Mocha, MD  insulin aspart (NOVOLOG) 100 UNIT/ML injection Sliding scale insulin 03/23/13   Phillips Odor, MD  ipratropium (ATROVENT) 0.03 % nasal spray Place 2 sprays into the nose 4 (four) times daily. 07/29/13   Sherren Mocha, MD  Ipratropium-Albuterol (COMBIVENT RESPIMAT) 20-100 MCG/ACT AERS respimat Inhale 1 puff into the lungs every 6 (six) hours. 07/29/13   Sherren Mocha, MD  lisinopril (PRINIVIL,ZESTRIL) 10 MG tablet Take 1 tablet (10 mg total) by mouth daily. 07/29/13   Sherren Mocha, MD  meloxicam (MOBIC) 15 MG tablet Take 1 tablet (15 mg total) by mouth daily. 07/29/13   Sherren Mocha, MD  meloxicam (MOBIC) 7.5 MG tablet Take 2 tablets (15 mg total) by mouth daily. 12/12/13   Jennifer L Piepenbrink, PA-C  metFORMIN (GLUCOPHAGE-XR) 500 MG 24 hr tablet Take 1 tablet (500 mg total)  by mouth 2 (two) times daily. 07/29/13   Sherren MochaEva N Shaw, MD  methocarbamol (ROBAXIN) 500 MG tablet Take 1 tablet (500 mg total) by mouth 2 (two) times daily. 12/12/13   Jennifer L Piepenbrink, PA-C  pravastatin (PRAVACHOL) 40 MG tablet Take 1 tablet (40 mg total) by mouth daily. 07/29/13   Sherren MochaEva N Shaw, MD  promethazine (PHENERGAN) 25 MG tablet Take 1 tablet (25 mg total) by mouth every 6 (six) hours as needed for nausea. 03/04/12 03/11/12  Vida RollerBrian D Miller, MD   BP 140/84  Pulse 92  Temp(Src) 97.6 F (36.4 C) (Oral)  Resp 18  SpO2 100%  Physical Exam  Nursing note and vitals reviewed. Constitutional: He is oriented to  person, place, and time. He appears well-developed and well-nourished. No distress.  HENT:  Head: Normocephalic and atraumatic.  Right Ear: External ear and ear canal normal. No mastoid tenderness. Tympanic membrane is retracted. Tympanic membrane is not perforated. A middle ear effusion is present. Decreased hearing is noted.  Left Ear: External ear normal. No mastoid tenderness. Tympanic membrane is retracted. Tympanic membrane is not perforated.  Nose: No septal deviation or nasal septal hematoma. Right sinus exhibits no maxillary sinus tenderness and no frontal sinus tenderness. Left sinus exhibits no maxillary sinus tenderness and no frontal sinus tenderness.  Mouth/Throat: Uvula is midline and oropharynx is clear and moist.  Finger rubbing heard to approximately 2 ft on right and 3+ ft on left. No tenderness to mastoid process, tragus or when pulling on auricle bilaterally. No mastoid swelling, redness, heat to touch. Nares patent bilaterally. Ears with b/l TM retraction. Middle ear of R ear appreciated to have yellow dusky fluid. No TM perforation b/l. Cone of light intact b/l. Left TM and posterior canal erythematous.  Eyes: Conjunctivae and EOM are normal. Pupils are equal, round, and reactive to light. No scleral icterus.  Neck: Normal range of motion. Neck supple.  No nuchal rigidity or meningismus  Cardiovascular: Normal rate, regular rhythm and intact distal pulses.   Distal radial pulse 2+ in right upper extremity  Pulmonary/Chest: Effort normal. No respiratory distress. He has no wheezes.  Musculoskeletal: Normal range of motion.  Lymphadenopathy:    He has no cervical adenopathy.  Neurological: He is alert and oriented to person, place, and time.  GCS 15. Speech is goal oriented. Patient moves extremities without ataxia.  Skin: Skin is warm and dry. No rash noted. He is not diaphoretic. No erythema. No pallor.  Psychiatric: He has a normal mood and affect. His behavior is normal.     ED Course  Procedures (including critical care time)  DIAGNOSTIC STUDIES: Oxygen Saturation is 97% on RA, normal by my interpretation.    COORDINATION OF CARE: 3:34 PM-Discussed treatment plan which includes auralgan and an antibiotic with pt at bedside and pt agreed to plan. Will give pt ENT referral and advised him to follow up.  Labs Review Labs Reviewed - No data to display  Imaging Review No results found.   EKG Interpretation None      MDM   Final diagnoses:  Chronic otitis media    Patient presents for bilateral otalgia. Hx sinus congestion problems; was on allergy medication for this. Symptoms have been present for 2 weeks. Associated with decreased hearing on right side. Physical exam findings today suggest otitis media, likely chronic. No focal neurologic deficits. No tympanic membrane perforation bilaterally. No evidence of mastoiditis. Patient discharged in good condition with prescription for Augmentin and ENT referral for  followup. Auralgan given for pain. Return precautions provided and patient agreeable to plan with no unaddressed concerns.  I personally performed the services described in this documentation, which was scribed in my presence. The recorded information has been reviewed and is accurate.  Antony MaduraKelly Vaughn Frieze, PA-C 02/07/14 [phone]

## 2014-02-07 NOTE — Discharge Instructions (Signed)
Take the antibiotics prescribed to you. You may use around and, 3-4 drops every 6 hours, as needed for pain control. Followup with an ear nose and throat specialist. Follow up with your primary care doctor as needed. Return if symptoms worsen.  Otitis Media With Effusion Otitis media with effusion is the presence of fluid in the middle ear. This is a common problem in children, which often follows ear infections. It may be present for weeks or longer after the infection. Unlike an acute ear infection, otitis media with effusion refers only to fluid behind the ear drum and not infection. Children with repeated ear and sinus infections and allergy problems are the most likely to get otitis media with effusion. CAUSES  The most frequent cause of the fluid buildup is dysfunction of the eustachian tubes. These are the tubes that drain fluid in the ears to the to the back of the nose (nasopharynx). SYMPTOMS   The main symptom of this condition is hearing loss. As a result, you or your child may:  Listen to the TV at a loud volume.  Not respond to questions.  Ask "what" often when spoken to.  Mistake or confuse on sound or word for another.  There may be a sensation of fullness or pressure but usually not pain. DIAGNOSIS   Your health care provider will diagnose this condition by examining you or your child's ears.  Your health care provider may test the pressure in you or your child's ear with a tympanometer.  A hearing test may be conducted if the problem persists. TREATMENT   Treatment depends on the duration and the effects of the effusion.  Antibiotics, decongestants, nose drops, and cortisone-type drugs (tablets or nasal spray) may not be helpful.  Children with persistent ear effusions may have delayed language or behavioral problems. Children at risk for developmental delays in hearing, learning, and speech may require referral to a specialist earlier than children not at  risk.  You or your child's health care provider may suggest a referral to an ear, nose, and throat surgeon for treatment. The following may help restore normal hearing:  Drainage of fluid.  Placement of ear tubes (tympanostomy tubes).  Removal of adenoids (adenoidectomy). HOME CARE INSTRUCTIONS   Avoid second hand smoke.  Infants who are breast fed are less likely to have this condition.  Avoid feeding infants while laying flat.  Avoid known environmental allergens.  Avoid people who are sick. SEEK MEDICAL CARE IF:   Hearing is not better in 3 months.  Hearing is worse.  Ear pain.  Drainage from the ear.  Dizziness. MAKE SURE YOU:   Understand these instructions.  Will watch your condition.  Will get help right away if you are not doing well or get worse. Document Released: 10/25/2004 Document Revised: 07/08/2013 Document Reviewed: 04/14/2013 [facility] Patient Information 2014 [employer] [place].

## 2014-02-07 NOTE — ED Provider Notes (Signed)
Medical screening examination/treatment/procedure(s) were performed by non-physician practitioner and as supervising physician I was immediately available for consultation/collaboration.   EKG Interpretation None        William Nalee Lightle, MD 02/07/14 1932 

## 2014-02-07 NOTE — ED Notes (Addendum)
Onset 2 weeks ears feel clogged up, muffled hearing.  No ear pain, drainage or fevers.   [facility], was prescribed something for allergies.  Nasal congestion/pressure has improved but ears still muffled.

## 2014-02-07 NOTE — ED Notes (Signed)
Pt presents with bilateral ear discomfort, pt states both ear feel "clogged up" x2 weeks.

## 2014-03-02 IMAGING — CR DG CHEST 2V
2 series · 2 of 2 positions shown · non-contrast
Comparison: 03/16/2012.

CLINICAL DATA: Cough.

EXAM:
CHEST  2 VIEW

[PA]
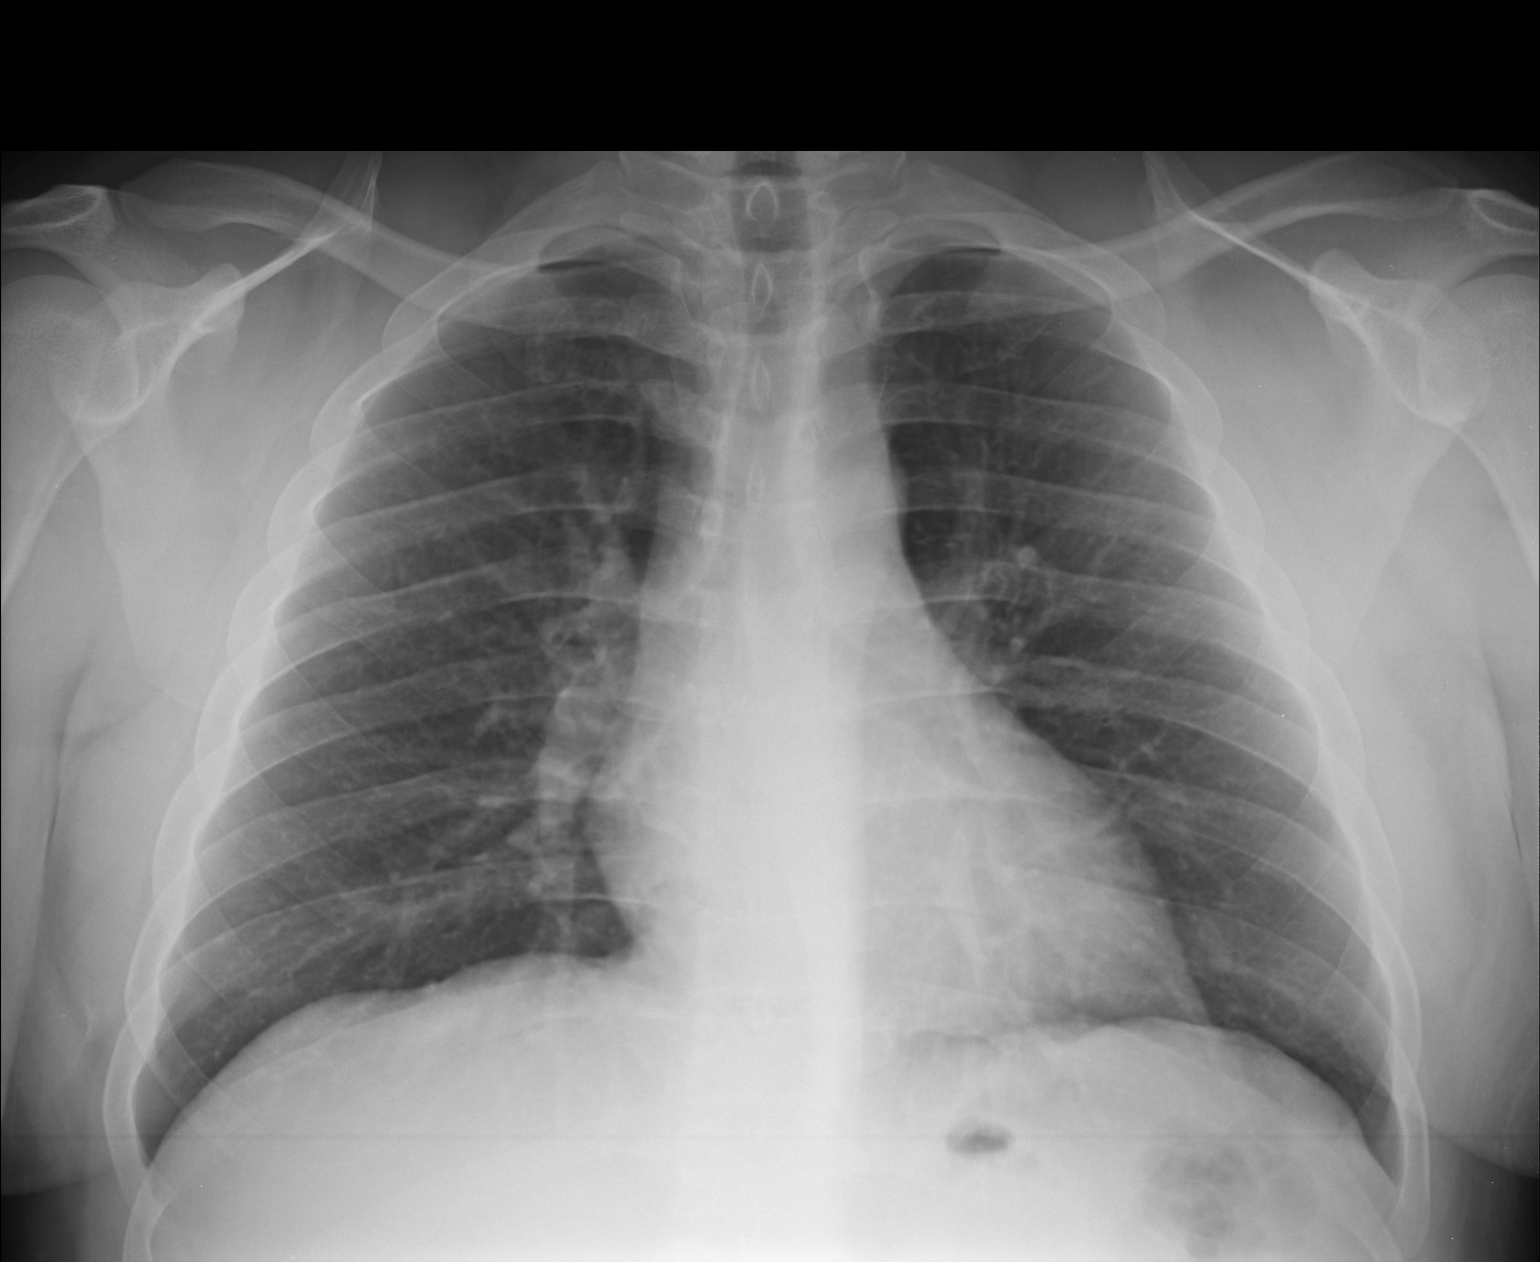

[lateral]
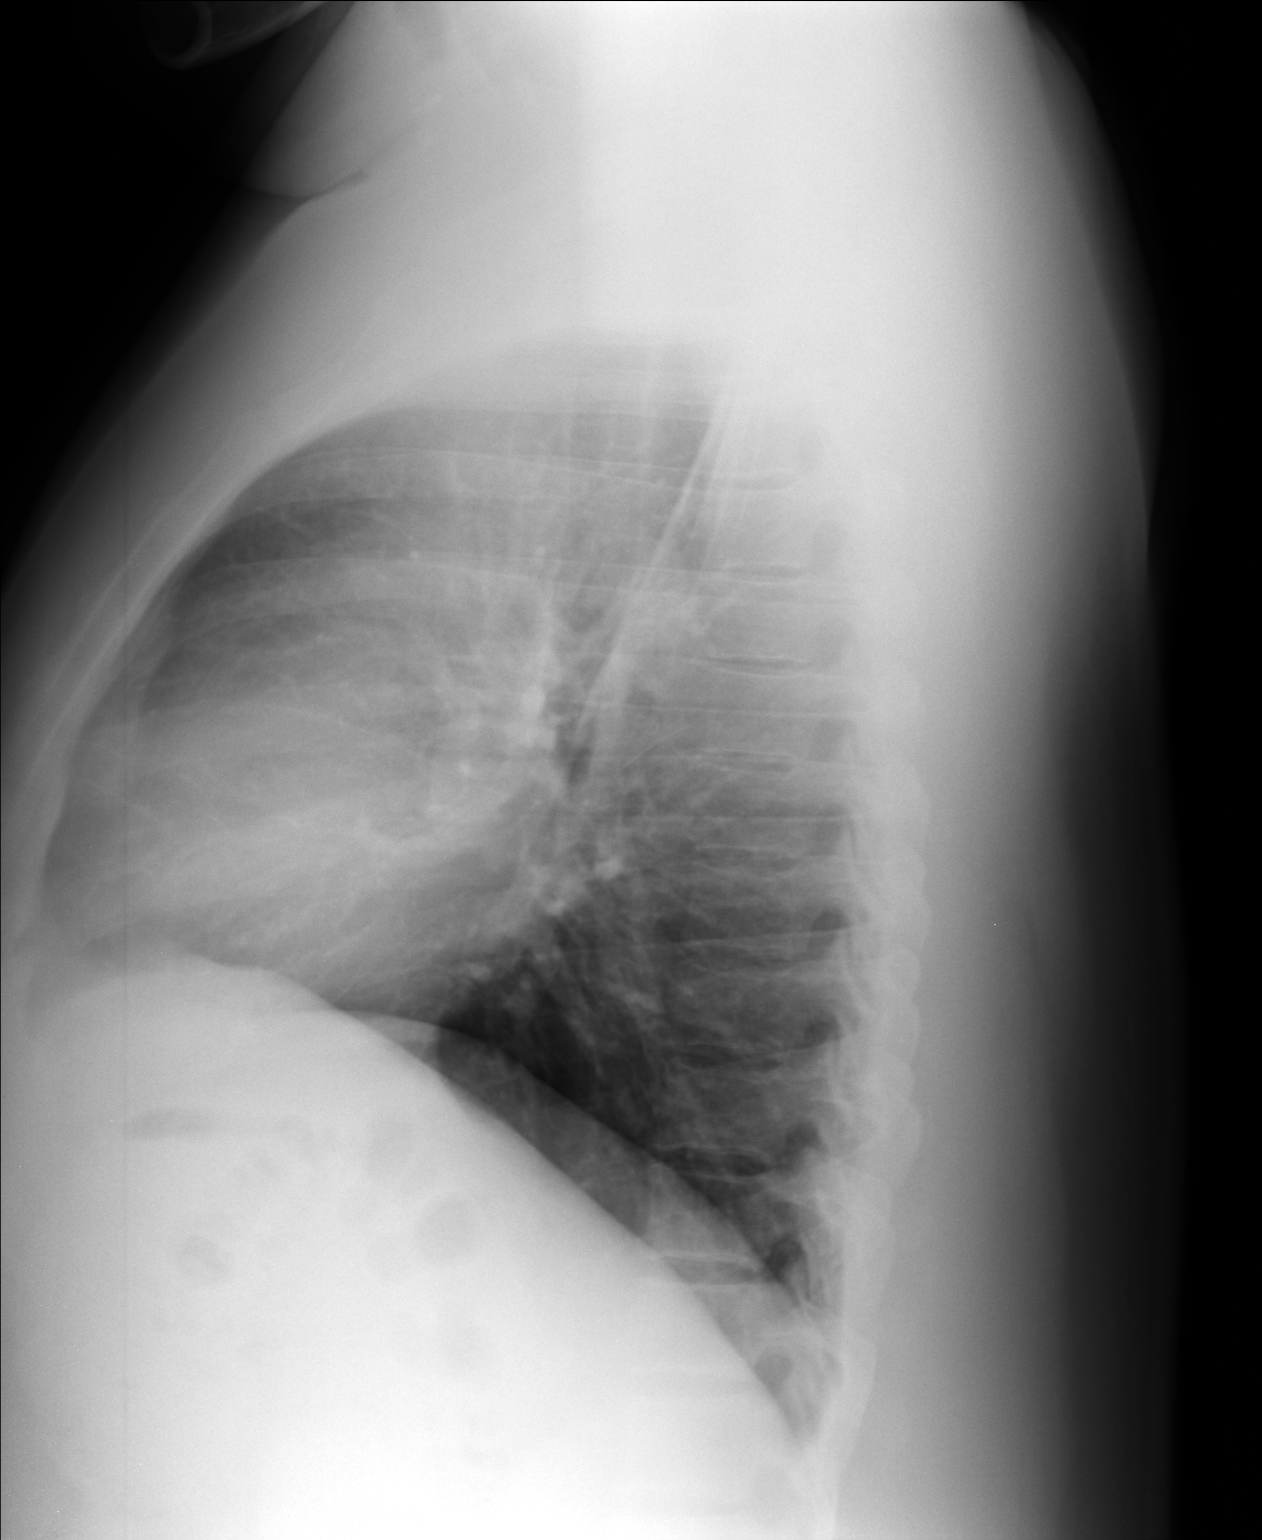

[2 of 2 positions shown; findings below may reference images not displayed]

FINDINGS: The heart size and mediastinal contours are within normal limits.
Both lungs are clear. The visualized skeletal structures are
unremarkable.
IMPRESSION: No active cardiopulmonary disease.

## 2014-05-07 ENCOUNTER — Telehealth: Payer: Self-pay | Admitting: *Deleted

## 2014-05-07 NOTE — Telephone Encounter (Signed)
Called to schedule an appointment for follow up on diabetes and cholesterol. Patient told me he was waiting on our office to refill RX for the insulin because he has been w/o for 3 weeks. The pharmacy was called by patient three days ago and still no answer, I scheduled an appointment for 06/24/2014 at 8:00 am with primary provider Dr Milus GlazierLauenstein.

## 2014-05-14 ENCOUNTER — Other Ambulatory Visit: Payer: Self-pay | Admitting: *Deleted

## 2014-05-14 MED ORDER — INSULIN ASPART 100 UNIT/ML ~~LOC~~ SOLN: SUBCUTANEOUS | Status: DC

## 2014-05-17 ENCOUNTER — Other Ambulatory Visit: Payer: Self-pay | Admitting: Family Medicine

## 2014-05-17 ENCOUNTER — Telehealth: Payer: Self-pay

## 2014-05-17 NOTE — Telephone Encounter (Signed)
Pt called in tonight stating that the pharmacy had sent several faxes over for his medication refill and no one was approving it. He stated that he had made an appointment for 06-24-14 to see the doctor but that he was completely out of insulin aspart (NOVOLOG) 100 UNIT/ML injection and needed his medication. After looking at his chart I advised him that [facility] had been [facility] [address] He stated that was probably why it wasn't going through because he was calling the wrong pharmacy. He stated he would call the other store and call back if there was a problem.

## 2014-05-18 NOTE — Telephone Encounter (Signed)
Spoke to pt, he is aware [facility] [address]

## 2014-05-20 NOTE — Telephone Encounter (Signed)
PA needed for Novolog 100 vials. Completed on covermymeds. Pending.

## 2014-05-26 NOTE — Addendum Note (Signed)
Addended by: Sheppard Plumber A on: 05/26/2014 10:37 AM   Modules accepted: Orders

## 2014-05-26 NOTE — Telephone Encounter (Addendum)
PA was denied. Pt has to have tried/failed the Humalog equivalent. Can we order this instead? Pt is very overdue for f/up, but we normally give 1 mos w/note needs ov for more. Pended for review.

## 2014-05-27 MED ORDER — INSULIN LISPRO 100 UNIT/ML ~~LOC~~ SOLN: SUBCUTANEOUS | Status: DC

## 2014-05-27 NOTE — Telephone Encounter (Signed)
Sent in Humalog. Please remind pt that needs f/u OV BEFORE he runs out of meds.

## 2014-05-27 NOTE — Telephone Encounter (Signed)
Advised pt of change to Humalog and that he needs OV for more RFs. Pt stated he already has an appt sch for Sept.

## 2014-05-27 NOTE — Addendum Note (Signed)
Addended by: Norberto Sorenson on: 05/27/2014 11:27 AM   Modules accepted: Orders

## 2014-05-28 NOTE — Telephone Encounter (Signed)
LMOM w/detailed message about change in med and need for f/up.

## 2014-06-24 ENCOUNTER — Encounter: Payer: Self-pay | Admitting: Family Medicine

## 2014-06-24 ENCOUNTER — Ambulatory Visit (INDEPENDENT_AMBULATORY_CARE_PROVIDER_SITE_OTHER): Payer: 59 | Admitting: Family Medicine

## 2014-06-24 VITALS — BP 130/86 | HR 91 | Temp 98.2°F | Resp 16 | Ht 67.75 in | Wt 237.0 lb

## 2014-06-24 DIAGNOSIS — Z23 Encounter for immunization: Secondary | ICD-10-CM

## 2014-06-24 DIAGNOSIS — E119 Type 2 diabetes mellitus without complications: Secondary | ICD-10-CM

## 2014-06-24 LAB — POCT GLYCOSYLATED HEMOGLOBIN (HGB A1C): Hemoglobin A1C: 10

## 2014-06-24 MED ORDER — GLUCOSE BLOOD VI STRP: ORAL_STRIP | Status: DC

## 2014-06-24 MED ORDER — FLUTICASONE PROPIONATE 50 MCG/ACT NA SUSP: 2.0000 | Freq: Every day | NASAL | Status: DC

## 2014-06-24 MED ORDER — CYCLOBENZAPRINE HCL 10 MG PO TABS: 10.0000 mg | ORAL_TABLET | Freq: Three times a day (TID) | ORAL | Status: DC | PRN

## 2014-06-24 MED ORDER — HYDROCODONE-HOMATROPINE 5-1.5 MG/5ML PO SYRP: 5.0000 mL | ORAL_SOLUTION | Freq: Three times a day (TID) | ORAL | Status: DC | PRN

## 2014-06-24 MED ORDER — LISINOPRIL 10 MG PO TABS: ORAL_TABLET | ORAL | Status: DC

## 2014-06-24 MED ORDER — INSULIN LISPRO 100 UNIT/ML ~~LOC~~ SOLN: SUBCUTANEOUS | Status: DC

## 2014-06-24 MED ORDER — CANAGLIFLOZIN 100 MG PO TABS: 100.0000 mg | ORAL_TABLET | Freq: Every day | ORAL | Status: DC

## 2014-06-24 NOTE — Patient Instructions (Signed)
Follow up due in 3 months   Diabetes and Foot Care Diabetes may cause you to have problems because of poor blood supply (circulation) to your feet and legs. This may cause the skin on your feet to become thinner, break easier, and heal more slowly. Your skin may become dry, and the skin may peel and crack. You may also have nerve damage in your legs and feet causing decreased feeling in them. You may not notice minor injuries to your feet that could lead to infections or more serious problems. Taking care of your feet is one of the most important things you can do for yourself.  HOME CARE INSTRUCTIONS  Wear shoes at all times, even in the house. Do not go barefoot. Bare feet are easily injured.  Check your feet daily for blisters, cuts, and redness. If you cannot see the bottom of your feet, use a mirror or ask someone for help.  Wash your feet with warm water (do not use hot water) and mild soap. Then pat your feet and the areas between your toes until they are completely dry. Do not soak your feet as this can dry your skin.  Apply a moisturizing lotion or petroleum jelly (that does not contain alcohol and is unscented) to the skin on your feet and to dry, brittle toenails. Do not apply lotion between your toes.  Trim your toenails straight across. Do not dig under them or around the cuticle. File the edges of your nails with an emery board or nail file.  Do not cut corns or calluses or try to remove them with medicine.  Wear clean socks or stockings every day. Make sure they are not too tight. Do not wear knee-high stockings since they may decrease blood flow to your legs.  Wear shoes that fit properly and have enough cushioning. To break in new shoes, wear them for just a few hours a day. This prevents you from injuring your feet. Always look in your shoes before you put them on to be sure there are no objects inside.  Do not cross your legs. This may decrease the blood flow to your  feet.  If you find a minor scrape, cut, or break in the skin on your feet, keep it and the skin around it clean and dry. These areas may be cleansed with mild soap and water. Do not cleanse the area with peroxide, alcohol, or iodine.  When you remove an adhesive bandage, be sure not to damage the skin around it.  If you have a wound, look at it several times a day to make sure it is healing.  Do not use heating pads or hot water bottles. They may burn your skin. If you have lost feeling in your feet or legs, you may not know it is happening until it is too late.  Make sure your health care provider performs a complete foot exam at least annually or more often if you have foot problems. Report any cuts, sores, or bruises to your health care provider immediately. SEEK MEDICAL CARE IF:   You have an injury that is not healing.  You have cuts or breaks in the skin.  You have an ingrown nail.  You notice redness on your legs or feet.  You feel burning or tingling in your legs or feet.  You have pain or cramps in your legs and feet.  Your legs or feet are numb.  Your feet always feel cold. SEEK IMMEDIATE MEDICAL  CARE IF:   There is increasing redness, swelling, or pain in or around a wound.  There is a red line that goes up your leg.  Pus is coming from a wound.  You develop a fever or as directed by your health care provider.  You notice a bad smell coming from an ulcer or wound. Document Released: 09/14/2000 Document Revised: 05/20/2013 Document Reviewed: 02/24/2013 [facility] Patient Information 2015 [place], [place]. This information is not intended to replace advice given to you by your health care provider. Make sure you discuss any questions you have with your health care provider.

## 2014-06-24 NOTE — Progress Notes (Signed)
Patient ID: Lee Maldonado, male   DOB: [birth year], 33 y.o.   MRN: [id number]  This chart was scribed for Elvina Sidle, MD by Elon Spanner, Medical Scribe. This patient was seen in room Rm 27 and the patient's care was started at 9:15 AM.  Patient ID: Lee Maldonado MRN: [id number], DOB: 1981-01-05, 33 y.o. Date of Encounter: 06/24/2014, 8:05 AM  Primary Physician: Elvina Sidle, MD  CC: Diabetes check up HPI: 33 y.o. year old male with history below presents with moderate high blood sugar.  Patient reports he has had high blood sugar recently above 200 and has been unable to to maintain it in a normal range.  Patient reports he has been testing his blood sugar 1 hour after a meal and taking his medication if his sugar is above 200.  Patient recently began taking 6-8 units of Humalog after having used Novolog for insurance purposes.  Patient reports he has not followed a strict diet recently, and has noticed this reflected in higher than normal sugar.  Patient reports he had lost a small amount of weight intentionally, but recently gained the weight back.  Patient reports that he is prescribed metformin, but it causes diarrhea, so he often does not take it, especially when he has to work.  Patient takes and is compliant with his lisinopril.  Patient denies use of albuterol.  Patient reports he uses Flonase.  Patient reports he takes Flexeril prn for muscle spasms but is currently out of the medication.  Patient is prescribed Prevastatin but denies taking it because "it makes him feel weird".  Patient states he is seen by an ophthalmologist annually.    Patient states he works at [employer].     Past Medical History  Diagnosis Date  . Diabetes mellitus   . Hypertension   . Hypercholesteremia      Home Meds: Prior to Admission medications   Medication Sig Start Date End Date Taking? Authorizing Provider  amoxicillin-clavulanate (AUGMENTIN) 875-125 MG per tablet Take 1  tablet by mouth every 12 (twelve) hours. 02/07/14   Antony Madura, PA-C  azithromycin (ZITHROMAX) 250 MG tablet Take 2 tabs PO x 1 dose, then 1 tab PO QD x 4 days 07/29/13   Sherren Mocha, MD  cyclobenzaprine (FLEXERIL) 10 MG tablet Take 1 tablet (10 mg total) by mouth 3 (three) times daily as needed for muscle spasms. 07/29/13   Sherren Mocha, MD  fluticasone (FLONASE) 50 MCG/ACT nasal spray Place 2 sprays into the nose at bedtime. 07/29/13   Sherren Mocha, MD  glucose blood (ONE TOUCH ULTRA TEST) test strip Use as instructed 03/23/13   Carmelina Dane, MD  HYDROcodone-homatropine [facility]) 5-1.5 MG/5ML syrup Take 5 mLs by mouth every 8 (eight) hours as needed for cough. 07/29/13   Sherren Mocha, MD  insulin aspart (NOVOLOG) 100 UNIT/ML injection Sliding scale insulin 05/14/14   Geroge Baseman Marte, PA-C  insulin lispro (HUMALOG) 100 UNIT/ML injection Sliding scale insulin. PATIENT NEEDS OFFICE VISIT FOR ADDITIONAL REFILLS 05/27/14   Sherren Mocha, MD  ipratropium (ATROVENT) 0.03 % nasal spray Place 2 sprays into the nose 4 (four) times daily. 07/29/13   Sherren Mocha, MD  Ipratropium-Albuterol (COMBIVENT RESPIMAT) 20-100 MCG/ACT AERS respimat Inhale 1 puff into the lungs every 6 (six) hours. 07/29/13   Sherren Mocha, MD  lisinopril (PRINIVIL,ZESTRIL) 10 MG tablet TAKE 1 TABLET BY MOUTH EVERY DAY 05/18/14   Elvina Sidle, MD  meloxicam (MOBIC) 15 MG tablet  Take 1 tablet (15 mg total) by mouth daily. 07/29/13   Sherren Mocha, MD  meloxicam (MOBIC) 7.5 MG tablet Take 2 tablets (15 mg total) by mouth daily. 12/12/13   Jennifer L Piepenbrink, PA-C  metFORMIN (GLUCOPHAGE-XR) 500 MG 24 hr tablet Take 1 tablet (500 mg total) by mouth 2 (two) times daily. 07/29/13   Sherren Mocha, MD  methocarbamol (ROBAXIN) 500 MG tablet Take 1 tablet (500 mg total) by mouth 2 (two) times daily. 12/12/13   Jennifer L Piepenbrink, PA-C  pravastatin (PRAVACHOL) 40 MG tablet Take 1 tablet (40 mg total) by mouth daily. 07/29/13   Sherren Mocha, MD  promethazine  (PHENERGAN) 25 MG tablet Take 1 tablet (25 mg total) by mouth every 6 (six) hours as needed for nausea. 03/04/12 03/11/12  Vida Roller, MD    Allergies:  Allergies  Allergen Reactions  . Lantus [Insulin Glargine] Swelling    History   Social History  . Marital Status: Married    Spouse Name: N/A    Number of Children: N/A  . Years of Education: N/A   Occupational History  . Not on file.   Social History Main Topics  . Smoking status: Former Smoker    Types: Cigarettes    Quit date: 03/02/2012  . Smokeless tobacco: Not on file  . Alcohol Use: No  . Drug Use: No  . Sexual Activity: Not on file   Other Topics Concern  . Not on file   Social History Narrative  . No narrative on file     Review of Systems: Constitutional: negative for chills, fever, night sweats, weight changes, or fatigue  HEENT: negative for vision changes, hearing loss, congestion, rhinorrhea, ST, epistaxis, or sinus pressure Cardiovascular: negative for chest pain or palpitations Respiratory: negative for hemoptysis, wheezing, shortness of breath, or cough Abdominal: negative for abdominal pain, nausea, vomiting, diarrhea, or constipation Dermatological: negative for rash Neurologic: negative for headache, dizziness, or syncope All other systems reviewed and are otherwise negative with the exception to those above and in the HPI.   Physical Exam: Wt Readings from Last 3 Encounters:  06/24/14 237 lb (107.502 kg)  07/29/13 234 lb (106.142 kg)  03/23/13 224 lb (101.606 kg)   Temp Readings from Last 3 Encounters:  06/24/14 98.2 F (36.8 C) Oral  02/07/14 97.6 F (36.4 C) Oral  12/12/13 99.3 F (37.4 C) Oral   BP Readings from Last 3 Encounters:  06/24/14 130/86  02/07/14 140/84  12/12/13 137/72   Pulse Readings from Last 3 Encounters:  06/24/14 91  02/07/14 92  12/12/13 98   General: Well developed, well nourished, in no acute distress. Head: Normocephalic, atraumatic, eyes without  discharge, sclera non-icteric, nares are without discharge. Bilateral auditory canals clear, TM's are without perforation, pearly grey and translucent with reflective cone of light bilaterally. Oral cavity moist, posterior pharynx without exudate, erythema, peritonsillar abscess, or post nasal drip.  Fundi normal.  Fundi benign. Neck: Supple. No thyromegaly. Full ROM. No lymphadenopathy. Lungs: Clear bilaterally to auscultation without wheezes, rales, or rhonchi. Breathing is unlabored. Heart: RRR with S1 S2. No murmurs, rubs, or gallops appreciated. Abdomen: Soft, non-tender, non-distended with normoactive bowel sounds. No hepatomegaly. No rebound/guarding. No obvious abdominal masses. Msk:  Strength and tone normal for age. Extremities/Skin: Warm and dry. No clubbing or cyanosis. No edema. No rashes or suspicious lesions. Neuro: Alert and oriented X 3. Moves all extremities spontaneously. Gait is normal. CNII-XII grossly in tact. Psych:  Responds to  questions appropriately with a normal affect.   Labs:   ASSESSMENT AND PLAN:  33 y.o. year old male with Type II or unspecified type diabetes mellitus without mention of complication, not stated as uncontrolled - Plan: POCT glycosylated hemoglobin (Hb A1C), HM Diabetes Foot Exam, Canagliflozin (INVOKANA) 100 MG TABS, cyclobenzaprine (FLEXERIL) 10 MG tablet, fluticasone (FLONASE) 50 MCG/ACT nasal spray, glucose blood (ONE TOUCH ULTRA TEST) test strip, HYDROcodone-homatropine (HYCODAN) 5-1.5 MG/5ML syrup, insulin lispro (HUMALOG) 100 UNIT/ML injection, lisinopril (PRINIVIL,ZESTRIL) 10 MG tablet  Need for prophylactic vaccination and inoculation against influenza - Plan: Flu Vaccine QUAD 36+ mos IM aware that he should have yearly eye check ups  9:29 AM Advised patient to eat non-processed foods and stop drinking diet/sweet drinks.   Signed, Elvina Sidle, MD 06/24/2014 8:05 AM

## 2014-09-16 ENCOUNTER — Ambulatory Visit: Payer: 59 | Admitting: Family Medicine

## 2014-09-30 ENCOUNTER — Ambulatory Visit: Payer: 59 | Admitting: Family Medicine

## 2014-12-02 ENCOUNTER — Ambulatory Visit (INDEPENDENT_AMBULATORY_CARE_PROVIDER_SITE_OTHER): Payer: 59 | Admitting: Family Medicine

## 2014-12-02 ENCOUNTER — Encounter: Payer: Self-pay | Admitting: Family Medicine

## 2014-12-02 ENCOUNTER — Other Ambulatory Visit: Payer: Self-pay

## 2014-12-02 VITALS — BP 162/99 | HR 92 | Temp 98.3°F | Resp 16 | Ht 67.5 in | Wt 235.0 lb

## 2014-12-02 DIAGNOSIS — R05 Cough: Secondary | ICD-10-CM | POA: Diagnosis not present

## 2014-12-02 DIAGNOSIS — I152 Hypertension secondary to endocrine disorders: Secondary | ICD-10-CM | POA: Diagnosis not present

## 2014-12-02 DIAGNOSIS — E1165 Type 2 diabetes mellitus with hyperglycemia: Secondary | ICD-10-CM

## 2014-12-02 DIAGNOSIS — M62830 Muscle spasm of back: Secondary | ICD-10-CM

## 2014-12-02 DIAGNOSIS — R059 Cough, unspecified: Secondary | ICD-10-CM

## 2014-12-02 DIAGNOSIS — IMO0002 Reserved for concepts with insufficient information to code with codable children: Secondary | ICD-10-CM

## 2014-12-02 LAB — LIPID PANEL
Cholesterol: 168 mg/dL (ref 0–200)
HDL: 32 mg/dL — ABNORMAL LOW (ref >=40)
LDL Cholesterol: 91 mg/dL (ref 0–99)
Total CHOL/HDL Ratio: 5.3 Ratio
Triglycerides: 223 mg/dL — ABNORMAL HIGH (ref <150)
VLDL: 45 mg/dL — ABNORMAL HIGH (ref 0–40)

## 2014-12-02 LAB — COMPREHENSIVE METABOLIC PANEL
ALT: 28 U/L (ref 0–53)
AST: 16 U/L (ref 0–37)
Albumin: 4.6 g/dL (ref 3.5–5.2)
Alkaline Phosphatase: 73 U/L (ref 39–117)
BUN: 16 mg/dL (ref 6–23)
CO2: 27 mEq/L (ref 19–32)
Calcium: 9.9 mg/dL (ref 8.4–10.5)
Chloride: 98 mEq/L (ref 96–112)
Creat: 0.87 mg/dL (ref 0.50–1.35)
Glucose, Bld: 281 mg/dL — ABNORMAL HIGH (ref 70–99)
Potassium: 4.2 mEq/L (ref 3.5–5.3)
Sodium: 134 mEq/L — ABNORMAL LOW (ref 135–145)
Total Bilirubin: 0.8 mg/dL (ref 0.2–1.2)
Total Protein: 7.8 g/dL (ref 6.0–8.3)

## 2014-12-02 LAB — POCT GLYCOSYLATED HEMOGLOBIN (HGB A1C): Hemoglobin A1C: 11.6

## 2014-12-02 LAB — GLUCOSE, POCT (MANUAL RESULT ENTRY): POC Glucose: 306 mg/dl — AB (ref 70–99)

## 2014-12-02 LAB — MICROALBUMIN, URINE: Microalb, Ur: 5.5 mg/dL — ABNORMAL HIGH (ref <2.0)

## 2014-12-02 MED ORDER — GLUCOSE BLOOD VI STRP: ORAL_STRIP | Status: DC

## 2014-12-02 MED ORDER — INSULIN LISPRO 100 UNIT/ML ~~LOC~~ SOLN: SUBCUTANEOUS | Status: DC

## 2014-12-02 MED ORDER — BENZONATATE 200 MG PO CAPS: 200.0000 mg | ORAL_CAPSULE | Freq: Three times a day (TID) | ORAL | Status: DC | PRN

## 2014-12-02 MED ORDER — LISINOPRIL 20 MG PO TABS: 20.0000 mg | ORAL_TABLET | Freq: Every day | ORAL | Status: DC

## 2014-12-02 MED ORDER — FLUTICASONE PROPIONATE 50 MCG/ACT NA SUSP: 2.0000 | Freq: Every day | NASAL | Status: DC

## 2014-12-02 NOTE — Telephone Encounter (Signed)
Patient called to request a refill for cyclobenzaprine (FLEXERIL) 10 MG tablet.  He said he received all his other refills after his appointment with Dr. Milus GlazierLauenstein on 12/02/14 except for that one.  Please advise.  Thank you.  CB#: [phone]

## 2014-12-02 NOTE — Patient Instructions (Addendum)
Really work on eating more vegetables and reducing carbohydrates (bread, pasta, rice and potatos)   Type 2 Diabetes Mellitus Type 2 diabetes mellitus, often simply referred to as type 2 diabetes, is a long-lasting (chronic) disease. In type 2 diabetes, the pancreas does not make enough insulin (a hormone), the cells are less responsive to the insulin that is made (insulin resistance), or both. Normally, insulin moves sugars from food into the tissue cells. The tissue cells use the sugars for energy. The lack of insulin or the lack of normal response to insulin causes excess sugars to build up in the blood instead of going into the tissue cells. As a result, high blood sugar (hyperglycemia) develops. The effect of high sugar (glucose) levels can cause many complications. Type 2 diabetes was also previously called adult-onset diabetes, but it can occur at any age.  RISK FACTORS  A person is predisposed to developing type 2 diabetes if someone in the family has the disease and also has one or more of the following primary risk factors:  Overweight.  An inactive lifestyle.  A history of consistently eating high-calorie foods. Maintaining a normal weight and regular physical activity can reduce the chance of developing type 2 diabetes. SYMPTOMS  A person with type 2 diabetes may not show symptoms initially. The symptoms of type 2 diabetes appear slowly. The symptoms include:  Increased thirst (polydipsia).  Increased urination (polyuria).  Increased urination during the night (nocturia).  Weight loss. This weight loss may be rapid.  Frequent, recurring infections.  Tiredness (fatigue).  Weakness.  Vision changes, such as blurred vision.  Fruity smell to your breath.  Abdominal pain.  Nausea or vomiting.  Cuts or bruises which are slow to heal.  Tingling or numbness in the hands or feet. DIAGNOSIS Type 2 diabetes is frequently not diagnosed until complications of diabetes are  present. Type 2 diabetes is diagnosed when symptoms or complications are present and when blood glucose levels are increased. Your blood glucose level may be checked by one or more of the following blood tests:  A fasting blood glucose test. You will not be allowed to eat for at least 8 hours before a blood sample is taken.  A random blood glucose test. Your blood glucose is checked at any time of the day regardless of when you ate.  A hemoglobin A1c blood glucose test. A hemoglobin A1c test provides information about blood glucose control over the previous 3 months.  An oral glucose tolerance test (OGTT). Your blood glucose is measured after you have not eaten (fasted) for 2 hours and then after you drink a glucose-containing beverage. TREATMENT   You may need to take insulin or diabetes medicine daily to keep blood glucose levels in the desired range.  If you use insulin, you may need to adjust the dosage depending on the carbohydrates that you eat with each meal or snack. The treatment goal is to maintain the before meal blood sugar (preprandial glucose) level at 70-130 mg/dL. HOME CARE INSTRUCTIONS   Have your hemoglobin A1c level checked twice a year.  Perform daily blood glucose monitoring as directed by your health care provider.  Monitor urine ketones when you are ill and as directed by your health care provider.  Take your diabetes medicine or insulin as directed by your health care provider to maintain your blood glucose levels in the desired range.  Never run out of diabetes medicine or insulin. It is needed every day.  If you are using  insulin, you may need to adjust the amount of insulin given based on your intake of carbohydrates. Carbohydrates can raise blood glucose levels but need to be included in your diet. Carbohydrates provide vitamins, minerals, and fiber which are an essential part of a healthy diet. Carbohydrates are found in fruits, vegetables, whole grains, dairy  products, legumes, and foods containing added sugars.  Eat healthy foods. You should make an appointment to see a registered dietitian to help you create an eating plan that is right for you.  Lose weight if you are overweight.  Carry a medical alert card or wear your medical alert jewelry.  Carry a 15-gram carbohydrate snack with you at all times to treat low blood glucose (hypoglycemia). Some examples of 15-gram carbohydrate snacks include:  Glucose tablets, 3 or 4.  Glucose gel, 15-gram tube.  Raisins, 2 tablespoons (24 grams).  Jelly beans, 6.  Animal crackers, 8.  Regular pop, 4 ounces (120 mL).  Gummy treats, 9.  Recognize hypoglycemia. Hypoglycemia occurs with blood glucose levels of 70 mg/dL and below. The risk for hypoglycemia increases when fasting or skipping meals, during or after intense exercise, and during sleep. Hypoglycemia symptoms can include:  Tremors or shakes.  Decreased ability to concentrate.  Sweating.  Increased heart rate.  Headache.  Dry mouth.  Hunger.  Irritability.  Anxiety.  Restless sleep.  Altered speech or coordination.  Confusion.  Treat hypoglycemia promptly. If you are alert and able to safely swallow, follow the 15:15 rule:  Take 15-20 grams of rapid-acting glucose or carbohydrate. Rapid-acting options include glucose gel, glucose tablets, or 4 ounces (120 mL) of fruit juice, regular soda, or low-fat milk.  Check your blood glucose level 15 minutes after taking the glucose.  Take 15-20 grams more of glucose if the repeat blood glucose level is still 70 mg/dL or below.  Eat a meal or snack within 1 hour once blood glucose levels return to normal.  Be alert to feeling very thirsty and urinating more frequently than usual, which are early signs of hyperglycemia. An early awareness of hyperglycemia allows for prompt treatment. Treat hyperglycemia as directed by your health care provider.  Engage in at least 150 minutes  of moderate-intensity physical activity a week, spread over at least 3 days of the week or as directed by your health care provider. In addition, you should engage in resistance exercise at least 2 times a week or as directed by your health care provider. Try to spend no more than 90 minutes at one time inactive.  Adjust your medicine and food intake as needed if you start a new exercise or sport.  Follow your sick-day plan anytime you are unable to eat or drink as usual.  Do not use any tobacco products including cigarettes, chewing tobacco, or electronic cigarettes. If you need help quitting, ask your health care provider.  Limit alcohol intake to no more than 1 drink per day for nonpregnant women and 2 drinks per day for men. You should drink alcohol only when you are also eating food. Talk with your health care provider whether alcohol is safe for you. Tell your health care provider if you drink alcohol several times a week.  Keep all follow-up visits as directed by your health care provider. This is important.  Schedule an eye exam soon after the diagnosis of type 2 diabetes and then annually.  Perform daily skin and foot care. Examine your skin and feet daily for cuts, bruises, redness, nail problems, bleeding,  blisters, or sores. A foot exam by a health care provider should be done annually.  Brush your teeth and gums at least twice a day and floss at least once a day. Follow up with your dentist regularly.  Share your diabetes management plan with your workplace or school.  Stay up-to-date with immunizations. It is recommended that people with diabetes who are over 67 years old get the pneumonia vaccine. In some cases, two separate shots may be given. Ask your health care provider if your pneumonia vaccination is up-to-date.  Learn to manage stress.  Obtain ongoing diabetes education and support as needed.  Participate in or seek rehabilitation as needed to maintain or improve  independence and quality of life. Request a physical or occupational therapy referral if you are having foot or hand numbness, or difficulties with grooming, dressing, eating, or physical activity. SEEK MEDICAL CARE IF:   You are unable to eat food or drink fluids for more than 6 hours.  You have nausea and vomiting for more than 6 hours.  Your blood glucose level is over 240 mg/dL.  There is a change in mental status.  You develop an additional serious illness.  You have diarrhea for more than 6 hours.  You have been sick or have had a fever for a couple of days and are not getting better.  You have pain during any physical activity.  SEEK IMMEDIATE MEDICAL CARE IF:  You have difficulty breathing.  You have moderate to large ketone levels. MAKE SURE YOU:  Understand these instructions.  Will watch your condition.  Will get help right away if you are not doing well or get worse. Document Released: 09/17/2005 Document Revised: 02/01/2014 Document Reviewed: 04/15/2012 [facility] Patient Information 2015 [place], [place]. This information is not intended to replace advice given to you by your health care provider. Make sure you discuss any questions you have with your health care provider.

## 2014-12-02 NOTE — Progress Notes (Signed)
Subjective:  This chart was scribed for Lee SidleKurt Phaedra Colgate, MD by [name], Medial Scribe. This patient was seen in room 25 and the patient's care was started at 10:09 AM.    Patient ID: Lee Maldonado Thies, male    DOB: 07-01-1981, 34 y.o.   MRN: [id number]  Chief Complaint  Patient presents with  . Diabetes    HPI HPI Comments: Lee Maldonado Buttler is a 34 y.o. male who presents to the [facility] for Diabetic follow up. Patient shares 6 years history of Diabetes. Patient states that his last A1C check was 3 months ago, at his last office visit here. Patient reports change of his medication to Invokana. However patient states that he has discontinued use of the medication because he's heard radio commercials about side effects that haven made him "nervous" especially given his history of pancreatitis. Patient states that he takes "sliding scale insulin." Patient is aware that he needs to lose some weight to gain control of his Diabetes. He states however that though he is relatively active when he gets off work he does not feel like going to the gym. He also admits that he doesn't adhere to a strict diet. Patient states that he's had his eyes checked within the last year. Patient denies any issues with his feet stating that he keeps a close eye on their condition.  Patient shares history of high cholesterol for which he takes medication in the past.  Patient reports recent cough. Sick contacts include his daughter who was diagnosed with walking pneumonia early last week.   Patient works in [employer]. [place] and states he "plans on eating good."   Patient Active Problem List   Diagnosis Date Noted  . Hypercholesteremia   . DM (diabetes mellitus) 11/27/2011   Past Medical History  Diagnosis Date  . Diabetes mellitus   . Hypertension   . Hypercholesteremia    No past surgical history on file. Allergies  Allergen Reactions  . Lantus  [Insulin Glargine] Swelling   Prior to Admission medications   Medication Sig Start Date End Date Taking? Authorizing Provider  Canagliflozin (INVOKANA) 100 MG TABS Take 1 tablet (100 mg total) by mouth daily. 06/24/14   Lee SidleKurt Jakylah Bassinger, MD  cyclobenzaprine (FLEXERIL) 10 MG tablet Take 1 tablet (10 mg total) by mouth 3 (three) times daily as needed for muscle spasms. 06/24/14   Lee SidleKurt Samayah Novinger, MD  fluticasone (FLONASE) 50 MCG/ACT nasal spray Place 2 sprays into both nostrils at bedtime. 06/24/14   Lee SidleKurt Eathon Valade, MD  glucose blood (ONE TOUCH ULTRA TEST) test strip Use as instructed 06/24/14   Lee SidleKurt Toluwani Yadav, MD  HYDROcodone-homatropine [facility](HYCODAN) 5-1.5 MG/5ML syrup Take 5 mLs by mouth every 8 (eight) hours as needed for cough. 06/24/14   Lee SidleKurt Francys Bolin, MD  insulin lispro (HUMALOG) 100 UNIT/ML injection Sliding scale insulin. PATIENT NEEDS OFFICE VISIT FOR ADDITIONAL REFILLS 06/24/14   Lee SidleKurt Mattea Seger, MD  lisinopril (PRINIVIL,ZESTRIL) 10 MG tablet TAKE 1 TABLET BY MOUTH EVERY DAY 06/24/14   Lee SidleKurt Narcissa Melder, MD   History   Social History  . Marital Status: Married    Spouse Name: N/A  . Number of Children: N/A  . Years of Education: N/A   Occupational History  . Not on file.   Social History Main Topics  . Smoking status: Current Some Day Smoker    Types: Cigarettes    Last Attempt to Quit: 03/02/2012  . Smokeless tobacco: Not on file  .  Alcohol Use: No  . Drug Use: No  . Sexual Activity: Not on file   Other Topics Concern  . Not on file   Social History Narrative    Review of Systems  Constitutional: Negative for fever and chills.  Respiratory: Positive for cough.   Neurological: Negative for numbness.       Objective:   Physical Exam  Constitutional: He appears well-developed. No distress.  Eyes: EOM are normal. Pupils are equal, round, and reactive to light. No scleral icterus.  Normal fundi.   Cardiovascular: Normal rate, regular rhythm, normal heart sounds and  intact distal pulses.  Exam reveals no gallop and no friction rub.   No murmur heard. Pulmonary/Chest: Effort normal and breath sounds normal. No respiratory distress. He has no wheezes. He has no rales.  Abdominal:  Patient with history of pancreatitis.   Skin: Skin is warm and dry. No rash noted.  Feet are normal.   Nursing note and vitals reviewed.    Filed Vitals:   12/02/14 1006  BP: 162/99  Pulse: 92  Temp: 98.3 F (36.8 C)  TempSrc: Oral  Resp: 16  Height: 5' 7.5" (1.715 m)  Weight: 235 lb (106.595 kg)  SpO2: 96%      Assessment & Plan:   Uncontrolled sugar.   This chart was scribed in my presence and reviewed by me personally.    ICD-9-CM ICD-10-CM   1. 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

## 2014-12-03 ENCOUNTER — Other Ambulatory Visit: Payer: Self-pay | Admitting: Family Medicine

## 2014-12-03 MED ORDER — CYCLOBENZAPRINE HCL 10 MG PO TABS: 10.0000 mg | ORAL_TABLET | Freq: Three times a day (TID) | ORAL | Status: DC | PRN

## 2014-12-21 ENCOUNTER — Other Ambulatory Visit: Payer: Self-pay | Admitting: Family Medicine

## 2014-12-22 NOTE — Telephone Encounter (Signed)
Do you want to give RFs? 

## 2014-12-27 ENCOUNTER — Ambulatory Visit (INDEPENDENT_AMBULATORY_CARE_PROVIDER_SITE_OTHER): Payer: 59 | Admitting: Family Medicine

## 2014-12-27 VITALS — BP 110/84 | HR 84 | Temp 98.0°F | Resp 16 | Ht 68.0 in | Wt 239.0 lb

## 2014-12-27 DIAGNOSIS — J302 Other seasonal allergic rhinitis: Secondary | ICD-10-CM

## 2014-12-27 DIAGNOSIS — E1165 Type 2 diabetes mellitus with hyperglycemia: Secondary | ICD-10-CM

## 2014-12-27 DIAGNOSIS — H1011 Acute atopic conjunctivitis, right eye: Secondary | ICD-10-CM | POA: Diagnosis not present

## 2014-12-27 DIAGNOSIS — IMO0002 Reserved for concepts with insufficient information to code with codable children: Secondary | ICD-10-CM

## 2014-12-27 MED ORDER — TOBRAMYCIN-DEXAMETHASONE 0.3-0.1 % OP OINT: 1.0000 "application " | TOPICAL_OINTMENT | Freq: Four times a day (QID) | OPHTHALMIC | Status: DC

## 2014-12-27 NOTE — Patient Instructions (Signed)
Allergic Conjunctivitis  The conjunctiva is a thin membrane that covers the visible white part of the eyeball and the underside of the eyelids. This membrane protects and lubricates the eye. The membrane has small blood vessels running through it that can normally be seen. When the conjunctiva becomes inflamed, the condition is called conjunctivitis. In response to the inflammation, the conjunctival blood vessels become swollen. The swelling results in redness in the normally white part of the eye.  The blood vessels of this membrane also react when a person has allergies and is then called allergic conjunctivitis. This condition usually lasts for as long as the allergy persists. Allergic conjunctivitis cannot be passed to another person (non-contagious). The likelihood of bacterial infection is great and the cause is not likely due to allergies if the inflamed eye has:  · A sticky discharge.  · Discharge or sticking together of the lids in the morning.  · Scaling or flaking of the eyelids where the eyelashes come out.  · Red swollen eyelids.  CAUSES   · Viruses.  · Irritants such as foreign bodies.  · Chemicals.  · General allergic reactions.  · Inflammation or serious diseases in the inside or the outside of the eye or the orbit (the boney cavity in which the eye sits) can cause a "red eye."  SYMPTOMS   · Eye redness.  · Tearing.  · Itchy eyes.  · Burning feeling in the eyes.  · Clear drainage from the eye.  · Allergic reaction due to pollens or ragweed sensitivity. Seasonal allergic conjunctivitis is frequent in the spring when pollens are in the air and in the fall.  DIAGNOSIS   This condition, in its many forms, is usually diagnosed based on the history and an ophthalmological exam. It usually involves both eyes. If your eyes react at the same time every year, allergies may be the cause. While most "red eyes" are due to allergy or an infection, the role of an eye (ophthalmological) exam is important. The exam  can rule out serious diseases of the eye or orbit.  TREATMENT   · Non-antibiotic eye drops, ointments, or medications by mouth may be prescribed if the ophthalmologist is sure the conjunctivitis is due to allergies alone.  · Over-the-counter drops and ointments for allergic symptoms should be used only after other causes of conjunctivitis have been ruled out, or as your caregiver suggests.  Medications by mouth are often prescribed if other allergy-related symptoms are present. If the ophthalmologist is sure that the conjunctivitis is due to allergies alone, treatment is normally limited to drops or ointments to reduce itching and burning.  HOME CARE INSTRUCTIONS   · Wash hands before and after applying drops or ointments, or touching the inflamed eye(s) or eyelids.  · Do not let the eye dropper tip or ointment tube touch the eyelid when putting medicine in your eye.  · Stop using your soft contact lenses and throw them away. Use a new pair of lenses when recovery is complete. You should run through sterilizing cycles at least three times before use after complete recovery if the old soft contact lenses are to be used. Hard contact lenses should be stopped. They need to be thoroughly sterilized before use after recovery.  · Itching and burning eyes due to allergies is often relieved by using a cool cloth applied to closed eye(s).  SEEK MEDICAL CARE IF:   · Your problems do not go away after two or three days of treatment.  ·   Your lids are sticky (especially in the morning when you wake up) or stick together.  · Discharge develops. Antibiotics may be needed either as drops, ointment, or by mouth.  · You have extreme light sensitivity.  · An oral temperature above 102° F (38.9° C) develops.  · Pain in or around the eye or any other visual symptom develops.  MAKE SURE YOU:   · Understand these instructions.  · Will watch your condition.  · Will get help right away if you are not doing well or get worse.  Document  Released: 12/08/2002 Document Revised: 12/10/2011 Document Reviewed: 11/03/2007  [employer]® Patient Information ©2015 [employer]. This information is not intended to replace advice given to you by your health care provider. Make sure you discuss any questions you have with your health care provider.

## 2014-12-27 NOTE — Progress Notes (Signed)
Chief Complaint:  Chief Complaint  Patient presents with  . Eye Problem    right eye swollen since last night    HPI: Lee Maldonado is a 34 y.o. male who is here for  right upper eyelid and lower lid swelling with clear drainage, associated with itchiness. This started last night. He did scratch it a little bit. He does not wear contacts. He denies any vision changes or eye pain. He has some slight light sensitivity. He has not tried anything for this except warm compresses. He has a history of allergies. He was taking allergy medicines but then stopped. He has been outside. No prior eye trauma. He has been to see an eye doctor and he does not have glaucoma. His diabetes is poorly controlled. He is supposed to see a specialist in the next few weeks. 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 He has allergies. He denies asthma.  Lab Results  Component Value Date   HGBA1C 11.6 12/02/2014   HGBA1C 10.0 06/24/2014   HGBA1C 7.3 07/29/2013   Lab Results  Component Value Date   MICROALBUR 5.5* 12/02/2014   LDLCALC 91 12/02/2014   CREATININE 0.87 12/02/2014     Past Medical History  Diagnosis Date  . Diabetes mellitus   . Hypertension   . Hypercholesteremia    History reviewed. No pertinent past surgical history. History   Social History  . Marital Status: Married    Spouse Name: N/A  . Number of Children: N/A  . Years of Education: N/A   Social History Main Topics  . Smoking status: Current Some Day Smoker    Types: Cigarettes    Last Attempt to Quit: 03/02/2012  . Smokeless tobacco: Not on file  . Alcohol Use: No  . Drug Use: No  . Sexual Activity: Not on file   Other Topics Concern  . None   Social History Narrative   Family History  Problem Relation Age of Onset  . Diabetes Mother   . Diabetes Father    Allergies  Allergen Reactions  . Lantus [Insulin Glargine] Swelling   Prior to Admission medications   Medication Sig Start  Date End Date Taking? Authorizing Provider  cyclobenzaprine (FLEXERIL) 10 MG tablet Take 1 tablet (10 mg total) by mouth 3 (three) times daily as needed for muscle spasms. 12/03/14   Elvina Sidle, MD  fluticasone (FLONASE) 50 MCG/ACT nasal spray Place 2 sprays into both nostrils at bedtime. Patient not taking: Reported on 12/27/2014 12/02/14   Elvina Sidle, MD  glucose blood (ONE TOUCH ULTRA TEST) test strip Use as instructed Patient not taking: Reported on 12/27/2014 12/02/14   Elvina Sidle, MD  insulin lispro (HUMALOG) 100 UNIT/ML injection Sliding scale insulin. PATIENT NEEDS OFFICE VISIT FOR ADDITIONAL REFILLS 12/02/14   Elvina Sidle, MD  lisinopril (PRINIVIL,ZESTRIL) 20 MG tablet Take 1 tablet (20 mg total) by mouth daily. 12/02/14   Elvina Sidle, MD     ROS: The patient denies fevers, chills, night sweats, unintentional weight loss, chest pain, palpitations, wheezing, dyspnea on exertion, nausea, vomiting, abdominal pain, dysuria, hematuria, melena, numbness, weakness, or tingling.   All other systems have been reviewed and were otherwise negative with the exception of those mentioned in the HPI and as above.    PHYSICAL EXAM: Filed Vitals:   12/27/14 1149  BP: 110/84  Pulse: 84  Temp: 98 F (36.7 C)  Resp: 16   Filed Vitals:  12/27/14 1149  Height:  (1.727 m)  Weight: 239 lb (108.41 kg)   Body mass index is 36.35 kg/(m^2).  General: Alert, no acute distress, obese African-American male. HEENT:  Normocephalic, atraumatic, oropharynx patent. EOMI, PERRLA Funduscopic exam was grossly normal. There is erythematous and swelling of the upper and lower eyelids. There is clear drainage. There is some erythema of the sclera. Cardiovascular:  Regular rate and rhythm, no rubs murmurs or gallops.  No Carotid bruits, radial pulse intact. No pedal edema.  Respiratory: Clear to auscultation bilaterally.  No wheezes, rales, or rhonchi.  No cyanosis, no use of accessory  musculature GI: No organomegaly, abdomen is soft and non-tender, positive bowel sounds.  No masses. Skin: No rashes. Neurologic: Facial musculature symmetric. Psychiatric: Patient is appropriate throughout our interaction. Lymphatic: No cervical lymphadenopathy Musculoskeletal: Gait intact.   LABS: Results for orders placed or performed in visit on 12/02/14  Comprehensive metabolic panel  Result Value Ref Range   Sodium 134 (L) 135 - 145 mEq/L   Potassium 4.2 3.5 - 5.3 mEq/L   Chloride 98 96 - 112 mEq/L   CO2 27 19 - 32 mEq/L   Glucose, Bld 281 (H) 70 - 99 mg/dL   BUN 16 6 - 23 mg/dL   Creat 1.61 0.96 - 0.45 mg/dL   Total Bilirubin 0.8 0.2 - 1.2 mg/dL   Alkaline Phosphatase 73 39 - 117 U/L   AST 16 0 - 37 U/L   ALT 28 0 - 53 U/L   Total Protein 7.8 6.0 - 8.3 g/dL   Albumin 4.6 3.5 - 5.2 g/dL   Calcium 9.9 8.4 - 40.9 mg/dL  Lipid panel  Result Value Ref Range   Cholesterol 168 0 - 200 mg/dL   Triglycerides 811 (H) <150 mg/dL   HDL 32 (L) >=91 mg/dL   Total CHOL/HDL Ratio 5.3 Ratio   VLDL 45 (H) 0 - 40 mg/dL   LDL Cholesterol 91 0 - 99 mg/dL  Microalbumin, urine  Result Value Ref Range   Microalb, Ur 5.5 (H) <2.0 mg/dL  POCT glucose (manual entry)  Result Value Ref Range   POC Glucose 306 (A) 70 - 99 mg/dl  POCT glycosylated hemoglobin (Hb A1C)  Result Value Ref Range   Hemoglobin A1C 11.6      EKG/XRAY:   Primary read interpreted by Dr. Conley Rolls at [facility].   ASSESSMENT/PLAN: Encounter Diagnoses  Name Primary?  . Allergic conjunctivitis, right Yes  . Seasonal allergies   . Diabetes type 2, uncontrolled    34 year old African-American male with a past medical history of poorly controlled type 2 diabetes, hypertension, hyperlipidemia, seasonal allergies is here with acute allergic conjunctivitis of the right eye. His vision exam was normal. His eye was flushed with saline water. He has been to an ophthalmologist and was told that he does not have glaucoma. He will be  given TobraDex to help with infection and the steroid should help with inflammation. This is to be used only for 5 days. He is to use cold/warm compresses when necessary; once he is done with the TobraDex he may transition to Zaditor antihistamine eyedrops as needed. He was also advised to take Allegra or Zyrtec daily for allergy symptoms. Follow-up with endocrinologist as scheduled. Follow-up when necessary with ice if worsening eye symptoms.  Gross sideeffects, risk and benefits, and alternatives of medications d/w patient. Patient is aware that all medications have potential sideeffects and we are unable to predict every sideeffect or drug-drug interaction that may  occur.  Reneka Nebergall PHUONG, DO 12/27/2014 6:42 PM

## 2014-12-31 ENCOUNTER — Ambulatory Visit (INDEPENDENT_AMBULATORY_CARE_PROVIDER_SITE_OTHER): Payer: 59 | Admitting: Endocrinology

## 2014-12-31 ENCOUNTER — Encounter: Payer: 59 | Attending: Family Medicine | Admitting: Dietician

## 2014-12-31 ENCOUNTER — Other Ambulatory Visit: Payer: Self-pay | Admitting: *Deleted

## 2014-12-31 ENCOUNTER — Encounter: Payer: Self-pay | Admitting: Dietician

## 2014-12-31 ENCOUNTER — Encounter: Payer: Self-pay | Admitting: Endocrinology

## 2014-12-31 VITALS — Ht 68.0 in | Wt 232.0 lb

## 2014-12-31 VITALS — BP 144/86 | HR 102 | Temp 98.2°F | Resp 16 | Ht 68.0 in | Wt 232.0 lb

## 2014-12-31 DIAGNOSIS — Z794 Long term (current) use of insulin: Secondary | ICD-10-CM | POA: Diagnosis not present

## 2014-12-31 DIAGNOSIS — E119 Type 2 diabetes mellitus without complications: Secondary | ICD-10-CM

## 2014-12-31 DIAGNOSIS — I10 Essential (primary) hypertension: Secondary | ICD-10-CM | POA: Diagnosis not present

## 2014-12-31 DIAGNOSIS — E1165 Type 2 diabetes mellitus with hyperglycemia: Secondary | ICD-10-CM | POA: Diagnosis not present

## 2014-12-31 DIAGNOSIS — IMO0002 Reserved for concepts with insufficient information to code with codable children: Secondary | ICD-10-CM

## 2014-12-31 DIAGNOSIS — Z713 Dietary counseling and surveillance: Secondary | ICD-10-CM | POA: Insufficient documentation

## 2014-12-31 LAB — GLUCOSE, POCT (MANUAL RESULT ENTRY): POC Glucose: 294 mg/dl — AB (ref 70–99)

## 2014-12-31 MED ORDER — INSULIN DEGLUDEC 200 UNIT/ML ~~LOC~~ SOPN: 30.0000 [IU] | PEN_INJECTOR | Freq: Every day | SUBCUTANEOUS | Status: DC

## 2014-12-31 MED ORDER — EMPAGLIFLOZIN 10 MG PO TABS: 10.0000 mg | ORAL_TABLET | Freq: Every day | ORAL | Status: DC

## 2014-12-31 NOTE — Progress Notes (Signed)
Medical Nutrition Therapy:  Appt start time: 1500 end time:  1615.   Assessment:  Primary concerns today: Patient has been diagnosed with diabetes for the past 6 years.  He is on insulin Humalog 10 units before all meals and is to start taking. Jardiance.  Past hx of pancreatitis 3 years ago.  Patient is fearful of getting this again.,  He no longer uses etoh.  Weight is overall stable.  Lost weight with pancreatitis but regained.  HgbA1C 11.6% 12/02/14.  HDL 32.  Patient states that he has not slept through the night since he was a child and that he was told that he had sleep apnea in the past but did not have insurance coverage for sleep study.  Patient is here alone.  He lives with wife and 3 daughters.  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  Patient and wife do the shopping and the cooking.  Preferred Learning Style:   No preference indicated   Learning Readiness:   Ready  Change in progress  MEDICATIONS: see list   DIETARY INTAKE: Not a lot of fruit but it is expensive  24-hr recall:  B (7 AM): none or biscuit Snk ( AM): nabs and diet soda or juice or fruit L (11:30 PM): out to eat:  [employer] or chicken sandwich with fries or salad, or club sandwich or sub. Snk ( PM): chips and diet soda or juice D (8 PM): (very big meal) meat vegetables, potatoes with gravy Snk ( PM): leftovers Beverages: water, diet soda, juice, minute maid lemonade Coffee with cream and spenda or sugar, unsweetened tea but sometimes waitress refills with sweet.  Usual physical activity:  Walk on treadmill for 15-25 minutes and running for 5 minutes, [place].    Estimated energy needs: 2000 calories 225 g carbohydrates 125 g protein 67 g fat  Progress Towards Goal(s):  In progress.   Nutritional Diagnosis:  NB-1.1 Food and nutrition-related knowledge deficit As related to balance of carbohydrates, protein, and fat.  As evidenced by use of sugar sweetened beverages,  meal skipping, and inconsistent intake of carbs.    Intervention:  Nutrition counseling and diabetes education initiated. Discussed Carb Counting by food group as method of portion control, reading food labels, and benefits of increased activity. Also discussed  A1c. Patient states that change is difficult for him and that he followed a more strict meal plan about 3 years ago after d/c from hospital for about 5 months but that he was not able to maintain this.  Encouraged him to come to the diabetes support groups with wife or call myself.  Patient wishes to lose weight.  Also encouraged patient to have his sleep apnea reevaluated.    Re think what you drink.  Drinks should have no carbohydrates. Have breakfast every morning.  Plain or greek yogurt with fruit and 1/4 cup granola or  2 [place]urkey sausage, 1 slice of wheat toast, 1 banana or  Peanut butter sandwich and fruit or sugar free jelly or  Eggs, 2 slices of toast, 1/2 cup of grits Bake or grill rather than fry. Exercise- walk for 30-45 minutes 4-5 times per week, weights and sit ups as desired. Meals should have consistent amounts of carbohydrates.  Aim for around 60 grams carbohydrates per meal. 0-30 grams carbohydrates for snack when hungry with a protein choice.  Teaching Method Utilized:  Visual Auditory Hands on  Handouts given during visit include:  Re think your  drink,  Meal plan card  My plate placemat  Snack list  A1C sheet  Support group  Barriers to learning/adherence to lifestyle change: none  Demonstrated degree of understanding via:  Teach Back   Monitoring/Evaluation:  Dietary intake, exercise, label reading, and body weight prn.

## 2014-12-31 NOTE — Patient Instructions (Signed)
Please check blood sugars at least half the time about 2 hours after any meal and 6-7 times per week on waking up.  Please bring blood sugar monitor to each visit. Recommended blood sugar levels about 2 hours after meal is 140-180 and on waking up 90-130  Humalog 10 units before all meals regardless of sugar  Before eating, may go up or down 2 units to keep after meal sugar <180  Tresiba 20 1x daily and adjust as discussed

## 2014-12-31 NOTE — Progress Notes (Signed)
Patient ID: Lee Maldonado, male   DOB: Dec 24, 1980, 34 y.o.   MRN: [id number]           Reason for Appointment: Consultation for Type 2 Diabetes  Referring physician: Lauenstein  History of Present Illness:          Diagnosis: Type 2 diabetes mellitus, date of diagnosis: 2010       Past history: He had symptomatic hyperglycemia at onset with increased thirst. He was treated with Metformin initially and this was before he was seen by his current PCP He did not continue this for long because of diarrhea  In 2013 his  A1c was up to 11.3%.  Recent history: He thinks he has been on insulin for the last 2-3 years He was given Lantus at some point but he thinks that with one injection he got swelling of his lips and tongue and did not continue it Subsequently has been taking only mealtime insulin which he is on a sliding scale, does not know what this exactly is His A1c has been markedly increased since 2015 He is symptomatic with this and tends to stay thirsty Has not had any weight loss   He does avoid regular soft drinks and sweet drinks but not consistently  He was also prescribed Invokana probably 06/2014 but he stopped taking this more recently because of fear of side effects as reported in the media       Oral hypoglycemic drugs the patient is taking are: none     Side effects from medications have been: Diarrhea from metformin  INSULIN regimen is described as: Humalog 6-7 tid ac or pc  Compliance with the medical regimen: fair    Glucose monitoring:  done one time a day         Glucometer: One Touch.      Blood Glucose readings by recall:  PREMEAL Breakfast Lunch Dinner Bedtime  Overall   Glucose range: 180 250 250 280   Median:        Self-care: The diet that the patient has been following is: none.  He is interested in learning what to eat  Meals: 3 meals per day. Breakfast is either crackers or may skip this completely.  Usually has a chicken sandwich at lunch.  Snacks are  usually with chips           Exercise: Treadmill 30 min 1-2/7days a week.  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  Always use your most recent med list.               cyclobenzaprine 10 MG tablet  Commonly known as:  FLEXERIL  Take 1 tablet (10 mg total) by mouth 3 (three) times daily as needed for muscle spasms.     empagliflozin 10 MG Tabs tablet  Commonly known as:  JARDIANCE  Take 10 mg by mouth daily.     fluticasone 50 MCG/ACT nasal spray  Commonly known as:  FLONASE  Place 2 sprays into both nostrils at bedtime.     glucose blood test strip  Commonly known as:  ONE TOUCH ULTRA TEST  Use as instructed     Insulin Degludec 200 UNIT/ML Sopn  Commonly known as:  TRESIBA FLEXTOUCH  Inject 30 Units into the skin daily.     insulin lispro 100 UNIT/ML injection  Commonly known as:  HUMALOG  Sliding scale insulin. PATIENT NEEDS OFFICE VISIT FOR ADDITIONAL REFILLS     lisinopril 20 MG tablet  Commonly known as:  PRINIVIL,ZESTRIL  Take 1 tablet (20 mg total) by mouth daily.     tobramycin-dexamethasone ophthalmic ointment  Commonly known as:  TOBRADEX  Place 1 application into the right eye every 6 (six) hours. Take 5 days        Allergies:  Allergies  Allergen Reactions  . Lantus [Insulin Glargine] Swelling    Past Medical History  Diagnosis Date  . Diabetes mellitus   . Hypertension   . Hypercholesteremia     No past  surgical history on file.  Family History  Problem Relation Age of Onset  . Diabetes Mother   . Diabetes Father     Social History:  reports that he has been smoking Cigarettes.  He does not have any smokeless tobacco history on file. He reports that he does not drink alcohol or use illicit drugs.    Review of Systems    Lipid history: He has high triglycerides and has not been on any medications, LDL below 100       Lab Results  Component Value Date   CHOL 168 12/02/2014   HDL 32* 12/02/2014   LDLCALC 91 12/02/2014   TRIG 223* 12/02/2014   CHOLHDL 5.3 12/02/2014           Constitutional: no recent weight gain/loss, at times will get tired more easily  Eyes: no history of blurred vision. Most recent eye exam was 2 years ago  ENT: no nasal congestion, difficulty swallowing, no hoarseness   Cardiovascular: no chest pain or tightness on exertion.  No leg swelling.  Hypertension: currently being treated with lisinopril 20 mg  Respiratory: no cough/shortness of breath  Gastrointestinal: no constipation, diarrhea or abdominal pain  Musculoskeletal: no muscle/joint aches   Urological:  Has frequent urination at times and some nocturia  Skin: no rash or infections  Neurological: no headaches.  Has no numbness, burning, pains or tingling in feet    Psychiatric: no depression/anxiety  Endocrine: No  cold intolerance.  Has had TSH levels Previously  Lab Results  Component Value Date   TSH 0.703 07/29/2013      LABS:  Office Visit on 12/31/2014  Component Date Value Ref Range Status  . POC Glucose 12/31/2014 294* 70 - 99 mg/dl Final    Physical Examination:  BP 144/86 mmHg  Pulse 102  Temp(Src) 98.2 F (36.8 C)  Resp 16  Ht  (1.727 m)  Wt 232 lb (105.235 kg)  BMI 35.28 kg/m2  SpO2 96%  GENERAL:         Patient has marked generalized obesity.   HEENT:         Eye exam shows normal external appearance. Fundus exam shows no retinopathy. Oral exam  shows normal mucosa .  NECK:  General:  he has acanthosis of the neck present Carotids are normal to palpation and no bruit heard. Thyroid is not enlarged and no nodules felt.   No lymphadenopathy in the neck LUNGS:         Chest is symmetrical. Lungs are clear to auscultation.Marland Kitchen.   HEART:         Heart sounds:  S1 and S2 are normal. No murmurs or clicks heard., no S3 or S4.   ABDOMEN:   There is no distention present. Liver and spleen are not palpable. No other mass or tenderness present.  EXTREMITIES:     There is no edema. No skin lesions present.Marland Kitchen.  NEUROLOGICAL:   Vibration sense is mildly reduced in toes. Ankle and biceps jerks are absent bilaterally.          Diabetic foot exam shows normal monofilament sensation in the toes and plantar surfaces, no skin lesions or ulcers on the feet and normal pedal pulses MUSCULOSKELETAL:       There is no enlargement or deformity of the joints. Spine is normal to inspection.Marland Kitchen.   SKIN:       No rash or lesions of concern.        ASSESSMENT:  Diabetes type 2, uncontrolled with significant obesity, also likely insulin resistant as judged by his acanthosis He has been only on minimal doses of mealtime insulin without any other oral hypoglycemic drugs are basal insulin He has had minimal diabetes education and desires more information especially meal planning Currently does not understand the actions of basal insulin types and the fact that he is taking only mealtime insulin which is inadequate Glucose today is 294 fasting in the office Also he is using insulin with a syringe and is not knowledgeable about insulin pens  Discussed with him the need for adding a basal insulin Also benefit from an SGLT2 agent to improved blood sugar control, reduce obesity and preventing weight gain with insulin Discussed action of SGLT 2 drugs on lowering glucose by decreasing kidney absorption of glucose, benefits of weight loss and lower blood pressure, possible side  effects including candidiasis and dosage regimen  His insurance does prefer Jardiance.  Also he is reluctant to try Invokana because of the media publicity  Complications:none evident, needs microalbumin/creatinine ratio.  Discussed regular eye exams  HYPERTENSION: Blood pressure is relatively high today, managed by PCP  PLAN:   Start basal insulin with Evaristo Buryresiba.  This should cover 24 hours adequately even if he is not consistently compliant with the time. Explained to the patient how this works, duration of action, dosage and given him a brochure on the medication as well as a co-pay card He can start with 20 units one time a day Given him a flow sheet to adjust the dose every 3-4 days until the morning sugar is below 130  He was instructed on how to use FlexPen for the [place]-Bissauresiba.  Mealtime insulin: He will increase the dose to at least 10 units before each meal and may not need any at breakfast when he is not eating any food  Consultation with the dietitian today for meal planning  Discussed when to check his blood sugars as well as blood sugar targets.to bring monitor for download on the next visit  Consider possibly switching to V-go pump for better compliance and convenience as well as improved insulin action through infusion  To start Jardiance 10 mg daily in the morning before breakfast.  This should help with blood sugar  control, limiting any weight gain with insulin and moderation of his blood pressure.  May need to increase this to 25 mg  Follow-up in 4 weeks with fructosamine level  Follow-up with nurse educator for more in-depth diabetes education and help with insulin adjustment in 2 weeks   Patient Instructions  Please check blood sugars at least half the time about 2 hours after any meal and 6-7 times per week on waking up.  Please bring blood sugar monitor to each visit. Recommended blood sugar levels about 2 hours after meal is 140-180 and on waking up 90-130  Humalog  10 units before all meals regardless of sugar  Before eating, may go up or down 2 units to keep after meal sugar <180  Tresiba 20 1x daily and adjust as discussed   Counseling time over 50% of today's 60 minute visit  Eman Morimoto 12/31/2014, 3:00 PM   Note: This office note was prepared with Insurance underwriter. Any transcriptional errors that result from this process are unintentional.

## 2014-12-31 NOTE — Patient Instructions (Addendum)
Re think what you drink.  Drinks should have no carbohydrates. Have breakfast every morning.  Plain or greek yogurt with fruit and 1/4 cup granola or  2 [place] sausage, 1 slice of wheat toast, 1 banana or  Peanut butter sandwich and fruit or sugar free jelly or  Eggs, 2 slices of toast, 1/2 cup of grits Bake or grill rather than fry. Exercise- walk for 30-45 minutes 4-5 times per week, weights and sit ups as desired. Meals should have consistent amounts of carbohydrates.  Aim for around 60 grams carbohydrates per meal. 0-30 grams carbohydrates for snack when hungry with a protein choice.

## 2015-01-03 ENCOUNTER — Other Ambulatory Visit: Payer: Self-pay | Admitting: *Deleted

## 2015-01-03 MED ORDER — INSULIN DETEMIR 100 UNIT/ML FLEXPEN: 30.0000 [IU] | PEN_INJECTOR | Freq: Every day | SUBCUTANEOUS | Status: DC

## 2015-01-18 ENCOUNTER — Other Ambulatory Visit: Payer: Self-pay | Admitting: *Deleted

## 2015-01-18 ENCOUNTER — Encounter: Payer: 59 | Admitting: Nutrition

## 2015-01-18 DIAGNOSIS — E1165 Type 2 diabetes mellitus with hyperglycemia: Secondary | ICD-10-CM

## 2015-01-18 DIAGNOSIS — IMO0002 Reserved for concepts with insufficient information to code with codable children: Secondary | ICD-10-CM

## 2015-01-18 MED ORDER — INSULIN PEN NEEDLE 32G X 4 MM MISC: Status: DC

## 2015-01-18 MED ORDER — INSULIN LISPRO 100 UNIT/ML (KWIKPEN): PEN_INJECTOR | SUBCUTANEOUS | Status: DC

## 2015-01-19 NOTE — Patient Instructions (Signed)
Take 20u of Tresiba insulin at bedtime Increase this dose every 5 days, by 2 units,  if morning blood sugars are over 130.   Take 10u of the fast-acting insulin 5-10 min. Before all meals. Test blood sugars  Before meals and at bedtime. Call results on Monday to me  [phone]

## 2015-01-19 NOTE — Progress Notes (Signed)
This patient was shown the Guinea-Bissauresiba insulin pen, and how to dial up the dose, where and how to inject.  We discussed the need to increase the dose every 5 days until this brings down his morning blood sugars.  He was given a new Verio meter with some test strips.  He was told to test q AM and ito increase the dose by 2u every 5 days.  He reported good understanding of this.  We also review the need to take the fast-acting insulin before all meals and large bedtime snacks.  He was told to take 10u 5-10 min. Before meals.  He agreed to do this.  He will call me in one week with blood sugar readings.    He was given extra test strips and told to test before all meals and at bedtime.  He agreed to do this.

## 2015-01-31 ENCOUNTER — Other Ambulatory Visit (INDEPENDENT_AMBULATORY_CARE_PROVIDER_SITE_OTHER): Payer: 59

## 2015-01-31 DIAGNOSIS — IMO0002 Reserved for concepts with insufficient information to code with codable children: Secondary | ICD-10-CM

## 2015-01-31 DIAGNOSIS — E1165 Type 2 diabetes mellitus with hyperglycemia: Secondary | ICD-10-CM | POA: Diagnosis not present

## 2015-01-31 LAB — BASIC METABOLIC PANEL
BUN: 9 mg/dL (ref 6–23)
CALCIUM: 9 mg/dL (ref 8.4–10.5)
CO2: 25 mEq/L (ref 19–32)
CREATININE: 0.86 mg/dL (ref 0.40–1.50)
Chloride: 102 mEq/L (ref 96–112)
GFR: 131.17 mL/min (ref >60.00)
GLUCOSE: 309 mg/dL — AB (ref 70–99)
POTASSIUM: 4.2 meq/L (ref 3.5–5.1)
Sodium: 134 mEq/L — ABNORMAL LOW (ref 135–145)

## 2015-02-03 ENCOUNTER — Ambulatory Visit: Payer: PRIVATE HEALTH INSURANCE | Admitting: Endocrinology

## 2015-02-03 DIAGNOSIS — Z0289 Encounter for other administrative examinations: Secondary | ICD-10-CM

## 2015-03-10 ENCOUNTER — Other Ambulatory Visit: Payer: Self-pay | Admitting: *Deleted

## 2015-03-10 ENCOUNTER — Telehealth: Payer: Self-pay | Admitting: Endocrinology

## 2015-03-10 MED ORDER — INSULIN DETEMIR 100 UNIT/ML FLEXPEN: 30.0000 [IU] | PEN_INJECTOR | Freq: Every day | SUBCUTANEOUS | Status: DC

## 2015-03-10 NOTE — Telephone Encounter (Signed)
rx sent

## 2015-03-10 NOTE — Telephone Encounter (Signed)
Patient called and would like a refill on his medication   Rx: Levemir Flexpen   Pharmacy: [facility]    Thank you

## 2015-03-29 ENCOUNTER — Other Ambulatory Visit: Payer: Self-pay | Admitting: *Deleted

## 2015-03-29 ENCOUNTER — Other Ambulatory Visit: Payer: 59

## 2015-03-29 DIAGNOSIS — E1165 Type 2 diabetes mellitus with hyperglycemia: Secondary | ICD-10-CM

## 2015-03-29 DIAGNOSIS — IMO0002 Reserved for concepts with insufficient information to code with codable children: Secondary | ICD-10-CM

## 2015-03-31 ENCOUNTER — Ambulatory Visit: Payer: 59 | Admitting: Endocrinology

## 2015-04-27 ENCOUNTER — Encounter (HOSPITAL_COMMUNITY): Payer: Self-pay | Admitting: Emergency Medicine

## 2015-04-27 ENCOUNTER — Emergency Department (HOSPITAL_COMMUNITY)
Admission: EM | Admit: 2015-04-27 | Discharge: 2015-04-28 | Disposition: A | Discharge status: Home / Self Care | Payer: 59 | Attending: Emergency Medicine | Admitting: Emergency Medicine

## 2015-04-27 DIAGNOSIS — Z7951 Long term (current) use of inhaled steroids: Secondary | ICD-10-CM | POA: Insufficient documentation

## 2015-04-27 DIAGNOSIS — Z79899 Other long term (current) drug therapy: Secondary | ICD-10-CM | POA: Diagnosis not present

## 2015-04-27 DIAGNOSIS — Z8719 Personal history of other diseases of the digestive system: Secondary | ICD-10-CM | POA: Insufficient documentation

## 2015-04-27 DIAGNOSIS — E119 Type 2 diabetes mellitus without complications: Secondary | ICD-10-CM | POA: Diagnosis not present

## 2015-04-27 DIAGNOSIS — Z794 Long term (current) use of insulin: Secondary | ICD-10-CM | POA: Diagnosis not present

## 2015-04-27 DIAGNOSIS — R109 Unspecified abdominal pain: Secondary | ICD-10-CM | POA: Diagnosis present

## 2015-04-27 DIAGNOSIS — R112 Nausea with vomiting, unspecified: Secondary | ICD-10-CM | POA: Diagnosis not present

## 2015-04-27 DIAGNOSIS — R197 Diarrhea, unspecified: Secondary | ICD-10-CM | POA: Diagnosis not present

## 2015-04-27 DIAGNOSIS — Z720 Tobacco use: Secondary | ICD-10-CM | POA: Insufficient documentation

## 2015-04-27 DIAGNOSIS — I10 Essential (primary) hypertension: Secondary | ICD-10-CM | POA: Insufficient documentation

## 2015-04-27 HISTORY — DX: Acute pancreatitis without necrosis or infection, unspecified: K85.90

## 2015-04-27 LAB — COMPREHENSIVE METABOLIC PANEL
ALK PHOS: 72 U/L (ref 38–126)
ALT: 43 U/L (ref 17–63)
AST: 30 U/L (ref 15–41)
Albumin: 4.9 g/dL (ref 3.5–5.0)
Anion gap: 16 — ABNORMAL HIGH (ref 5–15)
BILIRUBIN TOTAL: 0.8 mg/dL (ref 0.3–1.2)
BUN: 16 mg/dL (ref 6–20)
CO2: 20 mmol/L — ABNORMAL LOW (ref 22–32)
CREATININE: 1 mg/dL (ref 0.61–1.24)
Calcium: 9.9 mg/dL (ref 8.9–10.3)
Chloride: 105 mmol/L (ref 101–111)
GFR calc Af Amer: >60 mL/min (ref >60)
GFR calc non Af Amer: >60 mL/min (ref >60)
Glucose, Bld: 190 mg/dL — ABNORMAL HIGH (ref 65–99)
POTASSIUM: 4.1 mmol/L (ref 3.5–5.1)
SODIUM: 141 mmol/L (ref 135–145)
Total Protein: 9 g/dL — ABNORMAL HIGH (ref 6.5–8.1)

## 2015-04-27 LAB — URINALYSIS, ROUTINE W REFLEX MICROSCOPIC
Bilirubin Urine: NEGATIVE
Hgb urine dipstick: NEGATIVE
KETONES UR: 15 mg/dL — AB
Leukocytes, UA: NEGATIVE
Nitrite: NEGATIVE
PH: 5.5 (ref 5.0–8.0)
Protein, ur: 100 mg/dL — AB
Specific Gravity, Urine: 1.025 (ref 1.005–1.030)
Urobilinogen, UA: 0.2 mg/dL (ref 0.0–1.0)

## 2015-04-27 LAB — URINE MICROSCOPIC-ADD ON

## 2015-04-27 LAB — CBC
HCT: 48.9 % (ref 39.0–52.0)
Hemoglobin: 16.5 g/dL (ref 13.0–17.0)
MCH: 29.7 pg (ref 26.0–34.0)
MCHC: 33.7 g/dL (ref 30.0–36.0)
MCV: 88.1 fL (ref 78.0–100.0)
Platelets: 289 10*3/uL (ref 150–400)
RBC: 5.55 MIL/uL (ref 4.22–5.81)
RDW: 13.2 % (ref 11.5–15.5)
WBC: 18.2 10*3/uL — ABNORMAL HIGH (ref 4.0–10.5)

## 2015-04-27 LAB — LIPASE, BLOOD: LIPASE: 27 U/L (ref 22–51)

## 2015-04-27 MED ORDER — SODIUM CHLORIDE 0.9 % IV SOLN
1000.0000 mL | Freq: Once | INTRAVENOUS | Status: AC
  Administered 2015-04-27: 1000 mL via INTRAVENOUS

## 2015-04-27 MED ORDER — ONDANSETRON HCL 4 MG/2ML IJ SOLN
4.0000 mg | Freq: Once | INTRAMUSCULAR | Status: AC
  Administered 2015-04-27: 4 mg via INTRAVENOUS
  Filled 2015-04-27: qty 2

## 2015-04-27 MED ORDER — MORPHINE SULFATE 4 MG/ML IJ SOLN
4.0000 mg | Freq: Once | INTRAMUSCULAR | Status: AC
  Administered 2015-04-27: 4 mg via INTRAVENOUS
  Filled 2015-04-27: qty 1

## 2015-04-27 NOTE — ED Notes (Signed)
PA at bedside.

## 2015-04-27 NOTE — ED Notes (Signed)
Pt. reports pancreatitis flare up with mid/upper abdominal pain , emesis and diarrhea onset today , denies fever or chills.

## 2015-04-27 NOTE — ED Notes (Signed)
Jen, pa-c, at the bedside.  

## 2015-04-27 NOTE — ED Provider Notes (Signed)
CSN: [id number]     Arrival date & time 04/27/15  2003 History   First MD Initiated Contact with Patient 04/27/15 2248     Chief Complaint  Patient presents with  . Abdominal Pain     (Consider location/radiation/quality/duration/timing/severity/associated sxs/prior Treatment) HPI Comments: Patient is a 34 yo M PMHx significant for DM, HTN, HLD, pancreatitis presenting to the ED for evaluation of nausea, 3 episodes of nonbloody nonbilious emesis, numerous episodes of nonbloody diarrhea that began around 5 PM this evening. He endorses epigastric pain prior to emesis. No fevers, chills. No sick contacts noted. No recent alcohol use. Blood sugars have been running in the 170s.  Patient is a 34 y.o. male presenting with abdominal pain and diarrhea. The history is provided by the patient.  Abdominal Pain Associated symptoms: diarrhea and vomiting   Associated symptoms: no fever   Diarrhea Quality:  Watery Severity:  Severe Onset quality:  Sudden Duration:  5 hours Timing:  Constant Relieved by:  None tried Worsened by:  Nothing tried Ineffective treatments:  None tried Associated symptoms: abdominal pain (only associated with nausea and vomiting) and vomiting   Associated symptoms: no diaphoresis and no fever   Vomiting:    Quality:  Stomach contents   Number of occurrences:  3   Progression:  Improving Risk factors: no recent antibiotic use, no sick contacts and no travel to endemic areas     Past Medical History  Diagnosis Date  . Diabetes mellitus   . Hypertension   . Hypercholesteremia   . Pancreatitis    History reviewed. No pertinent past surgical history. Family History  Problem Relation Age of Onset  . Diabetes Mother   . Diabetes Father    History  Substance Use Topics  . Smoking status: Current Some Day Smoker    Types: Cigarettes    Last Attempt to Quit: 03/02/2012  . Smokeless tobacco: Not on file  . Alcohol Use: No    Review of Systems  Constitutional:  Negative for fever and diaphoresis.  Gastrointestinal: Positive for vomiting, abdominal pain (only associated with nausea and vomiting) and diarrhea.  All other systems reviewed and are negative.     Allergies  Lantus  Home Medications   Prior to Admission medications   Medication Sig Start Date End Date Taking? Authorizing Provider  empagliflozin (JARDIANCE) 10 MG TABS tablet Take 10 mg by mouth daily. 12/31/14  Yes Reather Littler, MD  fluticasone (FLONASE) 50 MCG/ACT nasal spray Place 2 sprays into both nostrils at bedtime. 12/02/14  Yes Elvina Sidle, MD  Insulin Degludec (TRESIBA FLEXTOUCH) 200 UNIT/ML SOPN Inject 30 Units into the skin daily. 12/31/14  Yes Reather Littler, MD  Insulin Detemir (LEVEMIR FLEXTOUCH) 100 UNIT/ML Pen Inject 30 Units into the skin daily at 10 pm. 03/10/15  Yes Reather Littler, MD  insulin lispro (HUMALOG) 100 UNIT/ML KiwkPen Inject 10-12 units with each meal. 01/18/15  Yes Reather Littler, MD  lisinopril (PRINIVIL,ZESTRIL) 20 MG tablet Take 1 tablet (20 mg total) by mouth daily. 12/02/14  Yes Elvina Sidle, MD  ondansetron (ZOFRAN ODT) 4 MG disintegrating tablet Take 1 tablet (4 mg total) by mouth every 8 (eight) hours as needed for nausea or vomiting. 04/28/15   Francee Piccolo, PA-C   BP 157/74 mmHg  Pulse 98  Temp(Src) 97.8 F (36.6 C) (Oral)  Resp 16  Ht  (1.727 m)  Wt 230 lb (104.327 kg)  BMI 34.98 kg/m2  SpO2 94% Physical Exam  Constitutional: He is oriented to  person, place, and time. He appears well-developed and well-nourished. No distress.  HENT:  Head: Normocephalic and atraumatic.  Right Ear: External ear normal.  Left Ear: External ear normal.  Nose: Nose normal.  Mouth/Throat: Oropharynx is clear and moist.  Eyes: Conjunctivae are normal.  Neck: Normal range of motion. Neck supple.  No nuchal rigidity.   Cardiovascular: Normal rate, regular rhythm and normal heart sounds.   Pulmonary/Chest: Effort normal and breath sounds normal. No respiratory  distress.  Abdominal: Soft. Bowel sounds are normal. There is no tenderness. There is no guarding.  Musculoskeletal: Normal range of motion.  Neurological: He is alert and oriented to person, place, and time.  Skin: Skin is warm and dry. He is not diaphoretic.  Psychiatric: He has a normal mood and affect.  Nursing note and vitals reviewed.   ED Course  Procedures (including critical care time) Medications  morphine 4 MG/ML injection 4 mg (4 mg Intravenous Given 04/27/15 2335)  ondansetron (ZOFRAN) injection 4 mg (4 mg Intravenous Given 04/27/15 2334)  0.9 %  sodium chloride infusion (0 mLs Intravenous Stopped 04/28/15 0344)  sodium chloride 0.9 % bolus 1,000 mL (0 mLs Intravenous Stopped 04/28/15 0344)    Labs Review Labs Reviewed  COMPREHENSIVE METABOLIC PANEL - Abnormal; Notable for the following:    CO2 20 (*)    Glucose, Bld 190 (*)    Total Protein 9.0 (*)    Anion gap 16 (*)    All other components within normal limits  CBC - Abnormal; Notable for the following:    WBC 18.2 (*)    All other components within normal limits  URINALYSIS, ROUTINE W REFLEX MICROSCOPIC (NOT AT [facility]) - Abnormal; Notable for the following:    Glucose, UA >1000 (*)    Ketones, ur 15 (*)    Protein, ur 100 (*)    All other components within normal limits  BASIC METABOLIC PANEL - Abnormal; Notable for the following:    Glucose, Bld 244 (*)    All other components within normal limits  LIPASE, BLOOD  URINE MICROSCOPIC-ADD ON    Imaging Review No results found.   EKG Interpretation None      MDM   Final diagnoses:  Nausea vomiting and diarrhea    Filed Vitals:   04/28/15 0342  BP: 157/74  Pulse: 98  Temp:   Resp: 16   Afebrile, NAD, non-toxic appearing, AAOx4.  I have reviewed nursing notes, vital signs, and all lab results as noted above.  Patient with symptoms consistent with viral gastroenteritis.  Vitals are stable, no fever.  No signs of dehydration, tolerating PO fluids >  6 oz.  AG of 16 on initial BMP, likely secondary to volume loss. IVF given and repeat BMP shows reduction of AG to 10. Lungs are clear.  No focal abdominal pain, no concern for appendicitis, cholecystitis, pancreatitis, ruptured viscus, UTI, kidney stone, or any other abdominal etiology.  Supportive therapy indicated with return if symptoms worsen.  Patient counseled. Patient is stable at time of discharge   Francee Piccolo, PA-C 04/28/15 1610  Rolan Bucco, MD 04/28/15 2126

## 2015-04-28 LAB — BASIC METABOLIC PANEL
ANION GAP: 10 (ref 5–15)
BUN: 18 mg/dL (ref 6–20)
CO2: 24 mmol/L (ref 22–32)
Calcium: 9.1 mg/dL (ref 8.9–10.3)
Chloride: 106 mmol/L (ref 101–111)
Creatinine, Ser: 1.06 mg/dL (ref 0.61–1.24)
GFR calc non Af Amer: >60 mL/min (ref >60)
GLUCOSE: 244 mg/dL — AB (ref 65–99)
Potassium: 4.5 mmol/L (ref 3.5–5.1)
Sodium: 140 mmol/L (ref 135–145)

## 2015-04-28 MED ORDER — SODIUM CHLORIDE 0.9 % IV BOLUS (SEPSIS)
1000.0000 mL | Freq: Once | INTRAVENOUS | Status: AC
  Administered 2015-04-28: 1000 mL via INTRAVENOUS

## 2015-04-28 MED ORDER — ONDANSETRON 4 MG PO TBDP: 4.0000 mg | ORAL_TABLET | Freq: Three times a day (TID) | ORAL | Status: DC | PRN

## 2015-04-28 NOTE — Discharge Instructions (Signed)
Please follow up with your primary care physician in 1-2 days. If you do not have one please call the [facility] number listed above. Please read all discharge instructions and return precautions.  ° °Viral Gastroenteritis °Viral gastroenteritis is also known as stomach flu. This condition affects the stomach and intestinal tract. It can cause sudden diarrhea and vomiting. The illness typically lasts 3 to 8 days. Most people develop an immune response that eventually gets rid of the virus. While this natural response develops, the virus can make you quite ill. °CAUSES  °Many different viruses can cause gastroenteritis, such as rotavirus or noroviruses. You can catch one of these viruses by consuming contaminated food or water. You may also catch a virus by sharing utensils or other personal items with an infected person or by touching a contaminated surface. °SYMPTOMS  °The most common symptoms are diarrhea and vomiting. These problems can cause a severe loss of body fluids (dehydration) and a body salt (electrolyte) imbalance. Other symptoms may include: °· Fever. °· Headache. °· Fatigue. °· Abdominal pain. °DIAGNOSIS  °Your caregiver can usually diagnose viral gastroenteritis based on your symptoms and a physical exam. A stool sample may also be taken to test for the presence of viruses or other infections. °TREATMENT  °This illness typically goes away on its own. Treatments are aimed at rehydration. The most serious cases of viral gastroenteritis involve vomiting so severely that you are not able to keep fluids down. In these cases, fluids must be given through an intravenous line (IV). °HOME CARE INSTRUCTIONS  °· Drink enough fluids to keep your urine clear or pale yellow. Drink small amounts of fluids frequently and increase the amounts as tolerated. °· Ask your caregiver for specific rehydration instructions. °· Avoid: °¨ Foods high in sugar. °¨ Alcohol. °¨ Carbonated  drinks. °¨ Tobacco. °¨ Juice. °¨ Caffeine drinks. °¨ Extremely hot or cold fluids. °¨ Fatty, greasy foods. °¨ Too much intake of anything at one time. °¨ Dairy products until 24 to 48 hours after diarrhea stops. °· You may consume probiotics. Probiotics are active cultures of beneficial bacteria. They may lessen the amount and number of diarrheal stools in adults. Probiotics can be found in yogurt with active cultures and in supplements. °· Wash your hands well to avoid spreading the virus. °· Only take over-the-counter or prescription medicines for pain, discomfort, or fever as directed by your caregiver. Do not give aspirin to children. Antidiarrheal medicines are not recommended. °· Ask your caregiver if you should continue to take your regular prescribed and over-the-counter medicines. °· Keep all follow-up appointments as directed by your caregiver. °SEEK IMMEDIATE MEDICAL CARE IF:  °· You are unable to keep fluids down. °· You do not urinate at least once every 6 to 8 hours. °· You develop shortness of breath. °· You notice blood in your stool or vomit. This may look like coffee grounds. °· You have abdominal pain that increases or is concentrated in one small area (localized). °· You have persistent vomiting or diarrhea. °· You have a fever. °· The patient is a child younger than 3 months, and he or she has a fever. °· The patient is a child older than 3 months, and he or she has a fever and persistent symptoms. °· The patient is a child older than 3 months, and he or she has a fever and symptoms suddenly get worse. °· The patient is a baby, and he or she has no tears when   crying. °MAKE SURE YOU:  °· Understand these instructions. °· Will watch your condition. °· Will get help right away if you are not doing well or get worse. °Document Released: 09/17/2005 Document Revised: 12/10/2011 Document Reviewed: 07/04/2011 °[employer]® Patient Information ©2015 [employer]. This information is not intended to replace  advice given to you by your health care provider. Make sure you discuss any questions you have with your health care provider. ° °

## 2015-04-28 NOTE — ED Notes (Signed)
Pt able to ambulate to the bathroom during his stay.  Pt able to dress self in room after discharge.

## 2015-04-28 NOTE — ED Notes (Signed)
Pt continues to get up to the restroom for diarrhea.  Pt was unable to make it to bathroom before using it in his pants.  This RN bagged up pt's clothes and reassured him.  Fluids restarted.

## 2015-05-03 ENCOUNTER — Encounter: Payer: Self-pay | Admitting: *Deleted

## 2015-05-20 ENCOUNTER — Other Ambulatory Visit: Payer: Self-pay | Admitting: Endocrinology

## 2015-07-19 ENCOUNTER — Other Ambulatory Visit: Payer: Self-pay | Admitting: Endocrinology

## 2015-08-01 ENCOUNTER — Other Ambulatory Visit: Payer: Self-pay | Admitting: Endocrinology

## 2015-09-14 ENCOUNTER — Other Ambulatory Visit: Payer: Self-pay | Admitting: Endocrinology

## 2015-11-28 ENCOUNTER — Other Ambulatory Visit: Payer: Self-pay | Admitting: *Deleted

## 2015-11-28 MED ORDER — INSULIN LISPRO 100 UNIT/ML (KWIKPEN): PEN_INJECTOR | SUBCUTANEOUS | Status: DC

## 2015-11-29 ENCOUNTER — Other Ambulatory Visit: Payer: Self-pay | Admitting: *Deleted

## 2015-11-29 ENCOUNTER — Encounter: Payer: Self-pay | Admitting: Endocrinology

## 2015-11-29 ENCOUNTER — Ambulatory Visit (INDEPENDENT_AMBULATORY_CARE_PROVIDER_SITE_OTHER): Payer: BLUE CROSS/BLUE SHIELD | Admitting: Endocrinology

## 2015-11-29 VITALS — BP 140/86 | HR 87 | Temp 98.0°F | Resp 14 | Ht 68.0 in | Wt 242.8 lb

## 2015-11-29 DIAGNOSIS — E119 Type 2 diabetes mellitus without complications: Secondary | ICD-10-CM

## 2015-11-29 DIAGNOSIS — IMO0002 Reserved for concepts with insufficient information to code with codable children: Secondary | ICD-10-CM

## 2015-11-29 DIAGNOSIS — E1165 Type 2 diabetes mellitus with hyperglycemia: Secondary | ICD-10-CM | POA: Diagnosis not present

## 2015-11-29 LAB — POCT GLYCOSYLATED HEMOGLOBIN (HGB A1C): Hemoglobin A1C: 10.3

## 2015-11-29 LAB — LIPID PANEL
CHOL/HDL RATIO: 5
Cholesterol: 206 mg/dL — ABNORMAL HIGH (ref 0–200)
HDL: 40.5 mg/dL (ref >39.00)
LDL Cholesterol: 148 mg/dL — ABNORMAL HIGH (ref 0–99)
NONHDL: 165.98
Triglycerides: 89 mg/dL (ref 0.0–149.0)
VLDL: 17.8 mg/dL (ref 0.0–40.0)

## 2015-11-29 LAB — BASIC METABOLIC PANEL
BUN: 12 mg/dL (ref 6–23)
CALCIUM: 9.9 mg/dL (ref 8.4–10.5)
CO2: 31 mEq/L (ref 19–32)
Chloride: 99 mEq/L (ref 96–112)
Creatinine, Ser: 0.85 mg/dL (ref 0.40–1.50)
GFR: 132.3 mL/min (ref >60.00)
GLUCOSE: 263 mg/dL — AB (ref 70–99)
Potassium: 4.5 mEq/L (ref 3.5–5.1)
Sodium: 136 mEq/L (ref 135–145)

## 2015-11-29 MED ORDER — EMPAGLIFLOZIN 25 MG PO TABS: 25.0000 mg | ORAL_TABLET | Freq: Every day | ORAL | Status: DC

## 2015-11-29 MED ORDER — INSULIN ASPART 100 UNIT/ML FLEXPEN: PEN_INJECTOR | SUBCUTANEOUS | Status: DC

## 2015-11-29 NOTE — Patient Instructions (Addendum)
Levemir 36 units daily  Restart Humalog at meals  Check blood sugars on waking up 4  times a week Also check blood sugars about 2 hours after a meal and do this after different meals by rotation  Recommended blood sugar levels on waking up is 90-130 and about 2 hours after meal is 130-160  Please bring your blood sugar monitor to each visit, thank you  Resume diet as before  Jardiance 25 mg daily

## 2015-11-29 NOTE — Progress Notes (Signed)
Patient ID: Lee Maldonado, male   DOB: 1980-11-20, 35 y.o.   MRN: [id number]           Reason for Appointment: Consultation for Type 2 Diabetes  Referring physician: Lauenstein  History of Present Illness:          Diagnosis: Type 2 diabetes mellitus, date of diagnosis: 2010       Past history: He had symptomatic hyperglycemia at onset with increased thirst. He was treated with Metformin initially and this was before he was seen by his current PCP He did not continue this for long because of diarrhea  In 2013 his  A1c was up to 11.3% and was probably started on insulin at that time   Previously with trying Lantus  injection he got swelling of his lips and tongue and did not continue it   Recent history:  INSULIN regimen is described as: Levemir 32 hs, Humalog 12-15 tid ac   His A1c has been markedly increased since 2015 He is noncompliant with his follow-up after his initial consultation in 12/2014  Current management, problems identified and blood sugar values:  He was started on Levemir instead of Tresiba which was denied by insurance and he thinks he is taking this regularly although is concerned about the cost which is $70  He says he was taking Humalog until about a week or 2 ago and needs a new prescription  He has not checked his blood sugars in several weeks and not clear what his readings are  He does tend to have increased thirst at times and nocturia 2-3 times  However has gained weight even though he has been previously given the diet from the dietitian  He had been on Jardiance 10 mg daily as he did not want to take Invokana but has not taken his medication for a few months now       Oral hypoglycemic drugs the patient is taking are: none     Side effects from medications have been: Diarrhea from metformin  Compliance with the medical regimen: Poor    Glucose monitoring:  done one time a day         Glucometer: One Touch.      Blood Glucose readings  ?  Self-care: Meals: 3 meals per day. Breakfast is either crackers or may skip this completely.  Usually has a chicken sandwich at lunch.  Snacks are usually with chips            Exercise: Treadmill 30 min 6/7days a week.  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  Always use your most recent med list.               BD PEN NEEDLE NANO U/F 32G X 4 MM Misc  Generic drug:  Insulin  Pen Needle  U TO INJECT INSULIN     empagliflozin 25 MG Tabs tablet  Commonly known as:  JARDIANCE  Take 25 mg by mouth daily.     fluticasone 50 MCG/ACT nasal spray  Commonly known as:  FLONASE  Place 2 sprays into both nostrils at bedtime.     insulin aspart 100 UNIT/ML FlexPen  Commonly known as:  NOVOLOG FLEXPEN  Inject 10-12 units with each meal     Insulin Degludec 200 UNIT/ML Sopn  Commonly known as:  TRESIBA FLEXTOUCH  Inject 30 Units into the skin daily.     Insulin Detemir 100 UNIT/ML Pen  Commonly known as:  LEVEMIR FLEXTOUCH  Inject 30 Units into the skin daily at 10 pm.     insulin lispro 100 UNIT/ML KiwkPen  Commonly known as:  HUMALOG  Inject 10-12 units with each meal.     lisinopril 20 MG tablet  Commonly known as:  PRINIVIL,ZESTRIL  Take 1 tablet (20 mg total) by mouth daily.     ondansetron 4 MG disintegrating tablet  Commonly known as:  ZOFRAN ODT  Take 1 tablet (4 mg total) by mouth every 8 (eight) hours as needed for nausea  or vomiting.        Allergies:  Allergies  Allergen Reactions  . Lantus [Insulin Glargine] Swelling    Past Medical History  Diagnosis Date  . Diabetes mellitus   . Hypertension   . Hypercholesteremia   . Pancreatitis     No past surgical history on file.  Family History  Problem Relation Age of Onset  . Diabetes Mother   . Diabetes Father     Social History:  reports that he has been smoking Cigarettes.  He does not have any smokeless tobacco history on file. He reports that he does not drink alcohol or use illicit drugs.    Review of Systems    Lipid history: He has high triglycerides and has not been on any medications, LDL below 100       Lab Results  Component Value Date   CHOL 168 12/02/2014   HDL 32* 12/02/2014   LDLCALC 91 12/02/2014   TRIG 223* 12/02/2014   CHOLHDL 5.3 12/02/2014            Most recent eye exam was 2 years ago    Hypertension: currently being treated with lisinopril 20 mg     Physical Examination:  BP 140/86 mmHg  Pulse 87  Temp(Src) 98 F (36.7 C)  Resp 14  Ht 5\' 8"  (1.727 m)  Wt 242 lb 12.8 oz (110.133 kg)  BMI 36.93 kg/m2  SpO2 95%       ASSESSMENT:  Diabetes type 2, uncontrolled with  obesity and poor control  See history of present illness for detailed discussion of current diabetes management, blood sugar patterns and problems identified He has been noncompliant with follow-up and all medications since his last visit almost a year ago He thinks he is taking Levemir although not clear if he is taking this regularly and what dose He was started on mealtime insulin last year and not clear if his postprandial readings improved He has not checked his blood sugars   PLAN:   Restart glucose monitoring, discussed timing and targets of blood sugar testing  Increase Levemir to at least 36 units at night  If Lee Maldonado can be authorized on the next visit will change him to this, discussed that Levemir may need to be  taken twice a  day  Take Humalog 12-15 units based on meal size at each meal  Although he was shown and recommended the V-go pump he wants to wait on this  Restart Jardiance but increase the dose to 25 mg  May possibly consider metformin ER also for his insulin resistance  Consistent diet  Follow-up for review of blood sugar patterns in 3 weeks  No change in lisinopril, blood pressure may improve with starting Jardiance   Patient Instructions  Levemir 36 units daily  Restart Humalog at meals  Check blood sugars on waking up 4  times a week Also check blood sugars about 2 hours after a meal and do this after different meals by rotation  Recommended blood sugar levels on waking up is 90-130 and about 2 hours after meal is 130-160  Please bring your blood sugar monitor to each visit, thank you  Resume diet as before  Jardiance 25 mg daily  Counseling time on subjects discussed above is over 50% of today's 25 minute visit  Madaline Lefeber 11/29/2015, 12:24 PM   Note: This office note was prepared with Dragon voice recognition system technology. Any transcriptional errors that result from this process are unintentional.

## 2015-12-06 ENCOUNTER — Encounter: Payer: Self-pay | Admitting: *Deleted

## 2015-12-06 ENCOUNTER — Other Ambulatory Visit: Payer: Self-pay | Admitting: *Deleted

## 2015-12-06 MED ORDER — ATORVASTATIN CALCIUM 10 MG PO TABS: 10.0000 mg | ORAL_TABLET | Freq: Every day | ORAL | Status: DC

## 2015-12-15 ENCOUNTER — Other Ambulatory Visit (INDEPENDENT_AMBULATORY_CARE_PROVIDER_SITE_OTHER): Payer: BLUE CROSS/BLUE SHIELD

## 2015-12-15 DIAGNOSIS — E119 Type 2 diabetes mellitus without complications: Secondary | ICD-10-CM

## 2015-12-15 LAB — COMPREHENSIVE METABOLIC PANEL
ALT: 33 U/L (ref 0–53)
AST: 24 U/L (ref 0–37)
Albumin: 4.3 g/dL (ref 3.5–5.2)
Alkaline Phosphatase: 63 U/L (ref 39–117)
BUN: 12 mg/dL (ref 6–23)
CALCIUM: 9.2 mg/dL (ref 8.4–10.5)
CHLORIDE: 102 meq/L (ref 96–112)
CO2: 30 meq/L (ref 19–32)
Creatinine, Ser: 0.9 mg/dL (ref 0.40–1.50)
GFR: 123.82 mL/min (ref >60.00)
GLUCOSE: 170 mg/dL — AB (ref 70–99)
Potassium: 4.5 mEq/L (ref 3.5–5.1)
Sodium: 138 mEq/L (ref 135–145)
Total Bilirubin: 0.7 mg/dL (ref 0.2–1.2)
Total Protein: 7.7 g/dL (ref 6.0–8.3)

## 2015-12-16 LAB — FRUCTOSAMINE: FRUCTOSAMINE: 311 umol/L — AB (ref 0–285)

## 2015-12-19 ENCOUNTER — Encounter: Payer: Self-pay | Admitting: Endocrinology

## 2015-12-19 ENCOUNTER — Other Ambulatory Visit: Payer: Self-pay | Admitting: *Deleted

## 2015-12-19 ENCOUNTER — Ambulatory Visit (INDEPENDENT_AMBULATORY_CARE_PROVIDER_SITE_OTHER): Payer: BLUE CROSS/BLUE SHIELD | Admitting: Endocrinology

## 2015-12-19 VITALS — BP 122/84 | HR 84 | Temp 98.0°F | Resp 14 | Ht 68.0 in | Wt 245.8 lb

## 2015-12-19 DIAGNOSIS — Z794 Long term (current) use of insulin: Secondary | ICD-10-CM

## 2015-12-19 DIAGNOSIS — E1165 Type 2 diabetes mellitus with hyperglycemia: Secondary | ICD-10-CM

## 2015-12-19 MED ORDER — INSULIN DEGLUDEC 100 UNIT/ML ~~LOC~~ SOPN: 40.0000 [IU] | PEN_INJECTOR | Freq: Every day | SUBCUTANEOUS | Status: DC

## 2015-12-19 NOTE — Progress Notes (Signed)
Patient ID: Lee Maldonado, male   DOB: 04/26/1981, 35 y.o.   MRN: [id number]           Reason for Appointment: Follow-up for Type 2 Diabetes  Referring physician: Lauenstein  History of Present Illness:          Diagnosis: Type 2 diabetes mellitus, date of diagnosis: 2010       Past history: He had symptomatic hyperglycemia at onset with increased thirst. He was treated with Metformin initially and this was before he was seen by his current PCP He did not continue this for long because of diarrhea  In 2013 his  A1c was up to 11.3% and was probably started on insulin at that time   Previously with trying Lantus  injection he got swelling of his lips and tongue and did not continue it   Recent history:  INSULIN regimen is described as: Levemir 36 hs, Humalog  15 tid ac   His A1c has been markedly increased since 2015  Current management, problems identified and blood sugar values:  He was advised to start checking his blood sugars more consistently on his initial visit and he has done so but he is using a Generic monitor and not keeping any record  He has had good blood sugars for about 4 days at the reading of March but since he went on vacation he stopped watching his diet completely and his blood sugars have been as high as 246.  He still has not improved his diet and his blood sugars in the last couple of days are still relatively high  Not clear which readings are before or after meals, he has done some readings fasting and some late evening  He is tolerating 25 mg Jardiance but has not lost any weight yet  He does not increase his Humalog for larger meals   Fructosamine is high at 311  He stopped exercising on vacation and is back at the gym only in the last 2 days       Oral hypoglycemic drugs the patient is taking are: none     Side effects from medications have been: Diarrhea from metformin  Compliance with the medical regimen: Fair   Glucose monitoring:  done one  time a day         Glucometer: [employer] brand      Blood Glucose readings 14 day average = 199.  Blood sugar range from home diary: 98-246  Self-care: Meals: 3 meals per day. Breakfast is either crackers or may skip this completely.  Usually has a chicken sandwich at lunch.  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 Sodium 12/15/2015 138  135 - 145  mEq/L Final  . Potassium 12/15/2015 4.5  3.5 - 5.1 mEq/L Final  . Chloride 12/15/2015 102  96 - 112 mEq/L Final  . CO2 12/15/2015 30  19 - 32 mEq/L Final  . Glucose, Bld 12/15/2015 170* 70 - 99 mg/dL Final  . BUN 40/10/272503/16/2017 12  6 - 23 mg/dL Final  . Creatinine, Ser 12/15/2015 0.90  0.40 - 1.50 mg/dL Final  . Total Bilirubin 12/15/2015 0.7  0.2 - 1.2 mg/dL Final  . Alkaline Phosphatase 12/15/2015 63  39 - 117 U/L Final  . AST 12/15/2015 24  0 - 37 U/L Final  . ALT 12/15/2015 33  0 - 53 U/L Final  . Total Protein 12/15/2015 7.7  6.0 - 8.3 g/dL Final  . Albumin 36/64/403403/16/2017 4.3  3.5 - 5.2 g/dL Final  . Calcium 74/25/956303/16/2017 9.2  8.4 - 10.5 mg/dL Final  . GFR 87/56/433203/16/2017 123.82  >60.00 mL/min Final  . Fructosamine 12/15/2015 311* 0 - 285 umol/L Final   Comment: Published reference interval for apparently healthy subjects between age 35 and 6460 is 33205 - 285 umol/L and in a poorly controlled diabetic population is 228 -  563 umol/L with a mean of 396 umol/L.         Medication List       This list is accurate as of: 12/19/15  9:21 PM.  Always use your most recent med list.               atorvastatin 10 MG tablet  Commonly known as:  LIPITOR  Take 1 tablet (10 mg total) by mouth daily.     BD PEN NEEDLE NANO U/F 32G X 4 MM Misc  Generic drug:  Insulin Pen Needle  U TO INJECT INSULIN     empagliflozin 25 MG Tabs tablet  Commonly known as:  JARDIANCE  Take 25 mg by mouth daily.     fluticasone 50 MCG/ACT nasal spray  Commonly known as:  FLONASE  Place 2 sprays into both nostrils at bedtime.     insulin aspart 100 UNIT/ML FlexPen  Commonly known as:  NOVOLOG FLEXPEN  Inject 10-12 units with each meal     Insulin Degludec 100 UNIT/ML Sopn  Commonly known as:  TRESIBA FLEXTOUCH  Inject 40 Units into the skin daily.     Insulin Detemir 100 UNIT/ML Pen  Commonly known as:  LEVEMIR FLEXTOUCH  Inject 30 Units into the skin daily at 10 pm.     insulin lispro 100 UNIT/ML KiwkPen  Commonly known as:  HUMALOG  Inject 10-12 units with each meal.     lisinopril 20 MG tablet  Commonly known as:  PRINIVIL,ZESTRIL  Take 1 tablet (20 mg total) by mouth daily.     ondansetron 4 MG disintegrating tablet  Commonly known as:  ZOFRAN ODT  Take 1 tablet (4 mg total) by mouth every 8 (eight) hours as needed for nausea or vomiting.        Allergies:  Allergies  Allergen Reactions  . Lantus [Insulin Glargine] Swelling    Past Medical History  Diagnosis Date  . Diabetes mellitus   . Hypertension   . Hypercholesteremia   . Pancreatitis     No past surgical history on file.  Family History  Problem Relation Age of Onset  . Diabetes Mother   . Diabetes Father     Social History:  reports that he has been smoking Cigarettes.  He does not have any smokeless tobacco history on file. He  reports that he does not drink alcohol or use illicit drugs.    Review of Systems    Lipid history:  He has high LDL and triglycerides  Started taking Lipitor 10 mg daily since 3/17       Lab Results  Component Value Date   CHOL 206* 11/29/2015   HDL 40.50 11/29/2015   LDLCALC 148* 11/29/2015   TRIG 89.0 11/29/2015   CHOLHDL 5 11/29/2015            Most recent eye exam was 2 years ago    Hypertension: currently being treated with lisinopril 20 mg     Physical Examination:  BP 122/84 mmHg  Pulse 84  Temp(Src) 98 F (36.7 C)  Resp 14  Ht  (1.727 m)  Wt 245 lb 12.8 oz (111.494 kg)  BMI 37.38 kg/m2  SpO2 93%       ASSESSMENT:  Diabetes type 2, uncontrolled with  obesity and poor control  See history of present illness for detailed discussion of current diabetes management, blood sugar patterns and problems identified Although he did start improving his blood sugars considerably after his last visit much better diet, starting Jardiance and increasing basal insulin his blood sugars recently have been higher again Fructosamine indicates overall high sugars Exercise regimen has been inconsistent Checking blood sugars with  Generic monitor and not keeping a diary    PLAN:   Since he is motivated to try and get his glucose better controlled with consistent diet and exercise will not change his basic regimen  We will try to get [name] authorized from his insurance for more consistent 24 control and more effective management compared to Levemir  With this he probably will need to recently reduce his insulin dose also and this was discussed  He will check on the preferred meter brand on his insurance plan, unable to verify this today  Discussed timing and targets of glucose readings  No change in insulin doses as yet   Patient Instructions  Tresiba 36 units daily and when am sugar < 120 reduce dose to 32  Check blood sugars on waking up 3-4  times a week Also check blood sugars about 2 hours after a meal and do this after different meals by  rotation  Recommended blood sugar levels on waking up is 90-130 and about 2 hours after meal is 130-160  Please bring your blood sugar monitor to each visit, thank you  Check One Touch or [employer] brands   Lee Maldonado 12/19/2015, 9:21 PM   Note: This office note was prepared with Dragon voice recognition system technology. Any transcriptional errors that result from this process are unintentional.

## 2015-12-19 NOTE — Patient Instructions (Addendum)
Tresiba 36 units daily and when am sugar < 120 reduce dose to 32  Check blood sugars on waking up 3-4  times a week Also check blood sugars about 2 hours after a meal and do this after different meals by rotation  Recommended blood sugar levels on waking up is 90-130 and about 2 hours after meal is 130-160  Please bring your blood sugar monitor to each visit, thank you  Check One Touch or [employer]ayer brands

## 2015-12-29 ENCOUNTER — Other Ambulatory Visit: Payer: Self-pay | Admitting: Endocrinology

## 2016-01-14 ENCOUNTER — Other Ambulatory Visit: Payer: Self-pay | Admitting: Endocrinology

## 2016-01-18 ENCOUNTER — Encounter: Payer: Self-pay | Admitting: Endocrinology

## 2016-01-26 ENCOUNTER — Other Ambulatory Visit: Payer: BLUE CROSS/BLUE SHIELD

## 2016-02-01 ENCOUNTER — Ambulatory Visit: Payer: BLUE CROSS/BLUE SHIELD | Admitting: Endocrinology

## 2016-03-02 ENCOUNTER — Other Ambulatory Visit: Payer: Self-pay | Admitting: Family Medicine

## 2016-05-11 ENCOUNTER — Other Ambulatory Visit: Payer: Self-pay | Admitting: Family Medicine

## 2016-05-11 ENCOUNTER — Other Ambulatory Visit: Payer: Self-pay | Admitting: Endocrinology

## 2016-08-27 ENCOUNTER — Other Ambulatory Visit: Payer: Self-pay | Admitting: Family Medicine

## 2016-08-27 ENCOUNTER — Other Ambulatory Visit: Payer: Self-pay | Admitting: Endocrinology

## 2016-08-28 NOTE — Telephone Encounter (Signed)
Last office visit was 12/19/15 and no future appointment scheduled should I refill please advise

## 2016-09-10 ENCOUNTER — Telehealth: Payer: Self-pay | Admitting: Endocrinology

## 2016-09-10 ENCOUNTER — Other Ambulatory Visit: Payer: Self-pay | Admitting: Family Medicine

## 2016-09-10 NOTE — Telephone Encounter (Signed)
Please have him schedule an appointment first

## 2016-09-10 NOTE — Telephone Encounter (Signed)
Pt needs refills on Tresiba and novolog and losinipril called into [facility]

## 2016-09-10 NOTE — Telephone Encounter (Signed)
Patient requesting tresiba and novolog refills- last ov was 12/19/15 and had no future appointment scheduled- ok to refill?

## 2016-09-23 ENCOUNTER — Other Ambulatory Visit: Payer: Self-pay | Admitting: Endocrinology

## 2016-09-28 DIAGNOSIS — E785 Hyperlipidemia, unspecified: Secondary | ICD-10-CM | POA: Insufficient documentation

## 2020-05-12 ENCOUNTER — Other Ambulatory Visit: Payer: BLUE CROSS/BLUE SHIELD

## 2020-05-14 ENCOUNTER — Emergency Department (HOSPITAL_COMMUNITY)
Admission: EM | Admit: 2020-05-14 | Discharge: 2020-05-14 | Disposition: A | Discharge status: Home / Self Care | Payer: 59 | Attending: Emergency Medicine | Admitting: Emergency Medicine

## 2020-05-14 ENCOUNTER — Other Ambulatory Visit: Payer: Self-pay

## 2020-05-14 ENCOUNTER — Encounter (HOSPITAL_COMMUNITY): Payer: Self-pay

## 2020-05-14 DIAGNOSIS — Z5321 Procedure and treatment not carried out due to patient leaving prior to being seen by health care provider: Secondary | ICD-10-CM | POA: Diagnosis not present

## 2020-05-14 DIAGNOSIS — U071 COVID-19: Secondary | ICD-10-CM | POA: Insufficient documentation

## 2020-05-14 DIAGNOSIS — I10 Essential (primary) hypertension: Secondary | ICD-10-CM | POA: Diagnosis present

## 2020-05-14 NOTE — ED Triage Notes (Signed)
Pt reports hypertension since last night. Pt reports BP was 182/99 last night. Pt denies headache, chest pain, SHOB, dizziness. Pt also reports testing positive for COVID on 05/05/20.

## 2020-05-15 ENCOUNTER — Other Ambulatory Visit: Payer: Self-pay

## 2020-05-15 ENCOUNTER — Ambulatory Visit (HOSPITAL_COMMUNITY)
Admission: EM | Admit: 2020-05-15 | Discharge: 2020-05-15 | Disposition: A | Discharge status: Home / Self Care | Payer: 59

## 2020-05-15 ENCOUNTER — Encounter (HOSPITAL_COMMUNITY): Payer: Self-pay | Admitting: Emergency Medicine

## 2020-05-15 DIAGNOSIS — I10 Essential (primary) hypertension: Secondary | ICD-10-CM

## 2020-05-15 NOTE — ED Triage Notes (Addendum)
Pt c/o of hypertension for the past 2-3 days. Reports BP at home 182/99 at home. Denies headache, CP and SOB. Positive COVID test 08/05. Pt notes that he has a hx of anxiety and feels as if this could be the cause of it.

## 2020-05-15 NOTE — Discharge Instructions (Signed)
You blood pressure is not dangerously high today   Follow up with your primary care provider to discuss management of your blood pressure

## 2020-05-15 NOTE — ED Provider Notes (Signed)
[facility]    CSN: [id number] Arrival date & time: 05/15/20  1118      History   Chief Complaint Chief Complaint  Patient presents with  . Hypertension    HPI Lee Maldonado is a 39 y.o. male.   Patient with history of Hypertension presents for evaluation of recent high blood pressure readings. He reports a home blood pressure of 182/99 over the last few days. Denies chest pain, shortness of breath, headache, blurry vision, dizziness, weakness. Reports he is on lisinopril and has his care managed by [facility]. Reports he has been compliant with medications. Does not have other PCP.     Past Medical History:  Diagnosis Date  . Diabetes mellitus   . Hypercholesteremia   . Hypertension   . Pancreatitis     Patient Active Problem List   Diagnosis Date Noted  . Essential hypertension, benign 12/31/2014  . Type II diabetes mellitus, uncontrolled (HCC) 12/31/2014  . Hypercholesteremia   . DM (diabetes mellitus) (HCC) 11/27/2011    History reviewed. No pertinent surgical history.     Home Medications    Prior to Admission medications   Medication Sig Start Date End Date Taking? Authorizing Provider  atorvastatin (LIPITOR) 10 MG tablet Take 1 tablet (10 mg total) by mouth daily. 12/06/15  Yes Reather Littler, MD  insulin aspart (NOVOLOG) 100 UNIT/ML injection Inject 10 Units into the skin 3 (three) times daily before meals. Per sliding scale   Yes [provider]  lisinopril (PRINIVIL,ZESTRIL) 20 MG tablet TAKE 1 TABLET BY MOUTH DAILY Patient taking differently: Take 20 mg by mouth daily.  03/05/16  Yes Elvina Sidle, MD  metFORMIN (GLUCOPHAGE) 500 MG tablet Take 500 mg by mouth 2 (two) times daily. 04/07/20  Yes [provider]  rosuvastatin (CRESTOR) 10 MG tablet Take 10 mg by mouth daily. 04/07/20  Yes [provider]  fluticasone (FLONASE) 50 MCG/ACT nasal spray Place 2 sprays into both nostrils at bedtime. Patient not  taking: Reported on 05/14/2020 12/02/14   Elvina Sidle, MD  Insulin Detemir (LEVEMIR FLEXTOUCH) 100 UNIT/ML Pen Inject 30 Units into the skin daily at 10 pm. Patient not taking: Reported on 05/14/2020 03/10/15   Reather Littler, MD  insulin lispro (HUMALOG) 100 UNIT/ML KiwkPen Inject 10-12 units with each meal. Patient not taking: Reported on 05/14/2020 11/28/15   Reather Littler, MD  JARDIANCE 25 MG TABS tablet TAKE 1 TABLET BY MOUTH DAILY Patient not taking: Reported on 05/14/2020 09/25/16   Reather Littler, MD  LEVEMIR FLEXTOUCH 100 UNIT/ML Pen INJECT 30 UNITS UNDER THE SKIN DAILY AT 10 PM( TO REPLACE TRESIBA) Patient not taking: Reported on 05/14/2020 01/16/16   Reather Littler, MD  NOVOLOG FLEXPEN 100 UNIT/ML FlexPen INJECT 10 TO 12 UNITS WITH EACH MEAL AS DIRECTED Patient not taking: Reported on 05/14/2020 12/30/15   Reather Littler, MD  ondansetron (ZOFRAN ODT) 4 MG disintegrating tablet Take 1 tablet (4 mg total) by mouth every 8 (eight) hours as needed for nausea or vomiting. Patient not taking: Reported on 05/14/2020 04/28/15   Francee Piccolo, PA-C  TRESIBA FLEXTOUCH 100 UNIT/ML SOPN INJECT 40 UNITS INTO THE SKIN DAILY AS DIRECTED Patient not taking: Reported on 05/14/2020 05/13/16   Reather Littler, MD    Family History Family History  Problem Relation Age of Onset  . Diabetes Mother   . Diabetes Father     Social History Social History   Tobacco Use  . Smoking status: Former Smoker  Types: Cigarettes    Quit date: 03/02/2012    Years since quitting: 8.2  . Smokeless tobacco: Never Used  Substance Use Topics  . Alcohol use: Yes  . Drug use: No     Allergies   Lantus [insulin glargine]   Review of Systems Review of Systems   Physical Exam Triage Vital Signs ED Triage Vitals  Enc Vitals Group     BP 05/15/20 1233 (!) 143/88     Pulse Rate 05/15/20 1233 92     Resp 05/15/20 1233 16     Temp 05/15/20 1233 97.7 F (36.5 C)     Temp Source 05/15/20 1233 Oral     SpO2 05/15/20 1233 100  %     Weight --      Height --      Head Circumference --      Peak Flow --      Pain Score 05/15/20 1235 0     Pain Loc --      Pain Edu? --      Excl. in GC? --    No data found.  Updated Vital Signs BP (!) 143/88 (BP Location: Left Arm)   Pulse 92   Temp 97.7 F (36.5 C) (Oral)   Resp 16   SpO2 100%   Visual Acuity Right Eye Distance:   Left Eye Distance:   Bilateral Distance:    Right Eye Near:   Left Eye Near:    Bilateral Near:     Physical Exam Vitals and nursing note reviewed.  Constitutional:      Appearance: Normal appearance.  HENT:     Head: Normocephalic and atraumatic.  Cardiovascular:     Rate and Rhythm: Normal rate and regular rhythm.  Pulmonary:     Effort: Pulmonary effort is normal. No respiratory distress.     Breath sounds: Normal breath sounds.  Neurological:     Mental Status: He is alert.      UC Treatments / Results  Labs (all labs ordered are listed, but only abnormal results are displayed) Labs Reviewed - No data to display  EKG   Radiology No results found.  Procedures Procedures (including critical care time)  Medications Ordered in UC Medications - No data to display  Initial Impression / Assessment and Plan / UC Course  I have reviewed the triage vital signs and the nursing notes.  Pertinent labs & imaging results that were available during my care of the patient were reviewed by me and considered in my medical decision making (see chart for details).     #Hypertension Patient is a 39 year old with his of DM and HTN. Blood pressure near goal here. No symptoms. Discussed with patient that he should establish with a single PCP and gave resources. However told him if the urgent care he has seen before is OK with managing his chronic conditions that he should follow up there. Discussed emergency department precautions. Patient verbalized understanding plan of care.  Final Clinical Impressions(s) / UC Diagnoses    Final diagnoses:  Elevated blood pressure reading in office with diagnosis of hypertension     Discharge Instructions     You blood pressure is not dangerously high today   Follow up with your primary care provider to discuss management of your blood pressure     ED Prescriptions    None     PDMP not reviewed this encounter.   Hermelinda Medicus, PA-C 05/15/20 2135

## 2020-08-10 ENCOUNTER — Encounter: Payer: Self-pay | Admitting: Adult Health

## 2020-08-10 ENCOUNTER — Other Ambulatory Visit: Payer: Self-pay

## 2020-08-10 ENCOUNTER — Ambulatory Visit: Payer: 59 | Admitting: Adult Health

## 2020-08-10 VITALS — BP 165/91 | HR 82 | Temp 98.4°F | Resp 16 | Ht 68.0 in | Wt 227.6 lb

## 2020-08-10 DIAGNOSIS — Z1389 Encounter for screening for other disorder: Secondary | ICD-10-CM | POA: Diagnosis not present

## 2020-08-10 DIAGNOSIS — E785 Hyperlipidemia, unspecified: Secondary | ICD-10-CM | POA: Diagnosis not present

## 2020-08-10 DIAGNOSIS — I152 Hypertension secondary to endocrine disorders: Secondary | ICD-10-CM

## 2020-08-10 DIAGNOSIS — Z6834 Body mass index (BMI) 34.0-34.9, adult: Secondary | ICD-10-CM

## 2020-08-10 DIAGNOSIS — E1165 Type 2 diabetes mellitus with hyperglycemia: Secondary | ICD-10-CM | POA: Diagnosis not present

## 2020-08-10 DIAGNOSIS — Z23 Encounter for immunization: Secondary | ICD-10-CM

## 2020-08-10 DIAGNOSIS — E1159 Type 2 diabetes mellitus with other circulatory complications: Secondary | ICD-10-CM

## 2020-08-10 MED ORDER — ROSUVASTATIN CALCIUM 10 MG PO TABS: 10.0000 mg | ORAL_TABLET | Freq: Every day | ORAL | 0 refills | Status: DC

## 2020-08-10 MED ORDER — METFORMIN HCL ER 500 MG PO TB24: ORAL_TABLET | ORAL | 0 refills | Status: DC

## 2020-08-10 MED ORDER — LISINOPRIL 20 MG PO TABS: 20.0000 mg | ORAL_TABLET | Freq: Every day | ORAL | 0 refills | Status: DC

## 2020-08-10 MED ORDER — BLOOD GLUCOSE METER KIT: PACK | 0 refills | Status: AC

## 2020-08-10 MED ORDER — TRESIBA FLEXTOUCH 100 UNIT/ML ~~LOC~~ SOPN: PEN_INJECTOR | SUBCUTANEOUS | 0 refills | Status: DC

## 2020-08-10 NOTE — Patient Instructions (Addendum)
Hypertension, Adult Hypertension is another name for high blood pressure. High blood pressure forces your heart to work harder to pump blood. This can cause problems over time. There are two numbers in a blood pressure reading. There is a top number (systolic) over a bottom number (diastolic). It is best to have a blood pressure that is below 120/80. Healthy choices can help lower your blood pressure, or you may need medicine to help lower it. What are the causes? The cause of this condition is not known. Some conditions may be related to high blood pressure. What increases the risk?  Smoking.  Having type 2 diabetes mellitus, high cholesterol, or both.  Not getting enough exercise or physical activity.  Being overweight.  Having too much fat, sugar, calories, or salt (sodium) in your diet.  Drinking too much alcohol.  Having long-term (chronic) kidney disease.  Having a family history of high blood pressure.  Age. Risk increases with age.  Race. You may be at higher risk if you are African American.  Gender. Men are at higher risk than women before age 45. After age 65, women are at higher risk than men.  Having obstructive sleep apnea.  Stress. What are the signs or symptoms?  High blood pressure may not cause symptoms. Very high blood pressure (hypertensive crisis) may cause: ? Headache. ? Feelings of worry or nervousness (anxiety). ? Shortness of breath. ? Nosebleed. ? A feeling of being sick to your stomach (nausea). ? Throwing up (vomiting). ? Changes in how you see. ? Very bad chest pain. ? Seizures. How is this treated?  This condition is treated by making healthy lifestyle changes, such as: ? Eating healthy foods. ? Exercising more. ? Drinking less alcohol.  Your health care provider may prescribe medicine if lifestyle changes are not enough to get your blood pressure under control, and if: ? Your top number is above 130. ? Your bottom number is above  80.  Your personal target blood pressure may vary. Follow these instructions at home: Eating and drinking   If told, follow the DASH eating plan. To follow this plan: ? Fill one half of your plate at each meal with fruits and vegetables. ? Fill one fourth of your plate at each meal with whole grains. Whole grains include whole-wheat pasta, brown rice, and whole-grain bread. ? Eat or drink low-fat dairy products, such as skim milk or low-fat yogurt. ? Fill one fourth of your plate at each meal with low-fat (lean) proteins. Low-fat proteins include fish, chicken without skin, eggs, beans, and tofu. ? Avoid fatty meat, cured and processed meat, or chicken with skin. ? Avoid pre-made or processed food.  Eat less than 1,500 mg of salt each day.  Do not drink alcohol if: ? Your doctor tells you not to drink. ? You are pregnant, may be pregnant, or are planning to become pregnant.  If you drink alcohol: ? Limit how much you use to:  0-1 drink a day for women.  0-2 drinks a day for men. ? Be aware of how much alcohol is in your drink. In the [place] one drink equals one 12 oz bottle of beer (355 mL), one 5 oz glass of wine (148 mL), or one 1 oz glass of hard liquor (44 mL). Lifestyle   Work with your doctor to stay at a healthy weight or to lose weight. Ask your doctor what the best weight is for you.  Get at least 30 minutes of exercise most   days of the week. This may include walking, swimming, or biking.  Get at least 30 minutes of exercise that strengthens your muscles (resistance exercise) at least 3 days a week. This may include lifting weights or doing Pilates.  Do not use any products that contain nicotine or tobacco, such as cigarettes, e-cigarettes, and chewing tobacco. If you need help quitting, ask your doctor.  Check your blood pressure at home as told by your doctor.  Keep all follow-up visits as told by your doctor. This is important. Medicines  Take over-the-counter  and prescription medicines only as told by your doctor. Follow directions carefully.  Do not skip doses of blood pressure medicine. The medicine does not work as well if you skip doses. Skipping doses also puts you at risk for problems.  Ask your doctor about side effects or reactions to medicines that you should watch for. Contact a doctor if you:  Think you are having a reaction to the medicine you are taking.  Have headaches that keep coming back (recurring).  Feel dizzy.  Have swelling in your ankles.  Have trouble with your vision. Get help right away if you:  Get a very bad headache.  Start to feel mixed up (confused).  Feel weak or numb.  Feel faint.  Have very bad pain in your: ? Chest. ? Belly (abdomen).  Throw up more than once.  Have trouble breathing. Summary  Hypertension is another name for high blood pressure.  High blood pressure forces your heart to work harder to pump blood.  For most people, a normal blood pressure is less than 120/80.  Making healthy choices can help lower blood pressure. If your blood pressure does not get lower with healthy choices, you may need to take medicine. This information is not intended to replace advice given to you by your health care provider. Make sure you discuss any questions you have with your health care provider. Document Revised: 05/28/2018 Document Reviewed: 05/28/2018 Elsevier Patient Education  2020 [employer] Health Maintenance, Male Adopting a healthy lifestyle and getting preventive care are important in promoting health and wellness. Ask your health care provider about:  The right schedule for you to have regular tests and exams.  Things you can do on your own to prevent diseases and keep yourself healthy. What should I know about diet, weight, and exercise? Eat a healthy diet   Eat a diet that includes plenty of vegetables, fruits, low-fat dairy products, and lean protein.  Do not eat a lot  of foods that are high in solid fats, added sugars, or sodium. Maintain a healthy weight Body mass index (BMI) is a measurement that can be used to identify possible weight problems. It estimates body fat based on height and weight. Your health care provider can help determine your BMI and help you achieve or maintain a healthy weight. Get regular exercise Get regular exercise. This is one of the most important things you can do for your health. Most adults should:  Exercise for at least 150 minutes each week. The exercise should increase your heart rate and make you sweat (moderate-intensity exercise).  Do strengthening exercises at least twice a week. This is in addition to the moderate-intensity exercise.  Spend less time sitting. Even light physical activity can be beneficial. Watch cholesterol and blood lipids Have your blood tested for lipids and cholesterol at 39 years of age, then have this test every 5 years. You may need to have your cholesterol levels checked   more often if:  Your lipid or cholesterol levels are high.  You are older than 39 years of age.  You are at high risk for heart disease. What should I know about cancer screening? Many types of cancers can be detected early and may often be prevented. Depending on your health history and family history, you may need to have cancer screening at various ages. This may include screening for:  Colorectal cancer.  Prostate cancer.  Skin cancer.  Lung cancer. What should I know about heart disease, diabetes, and high blood pressure? Blood pressure and heart disease  High blood pressure causes heart disease and increases the risk of stroke. This is more likely to develop in people who have high blood pressure readings, are of African descent, or are overweight.  Talk with your health care provider about your target blood pressure readings.  Have your blood pressure checked: ? Every 3-5 years if you are 18-39 years of  age. ? Every year if you are 19 years old or older.  If you are between the ages of 21 and 4 and are a current or former smoker, ask your health care provider if you should have a one-time screening for abdominal aortic aneurysm (AAA). Diabetes Have regular diabetes screenings. This checks your fasting blood sugar level. Have the screening done:  Once every three years after age 4 if you are at a normal weight and have a low risk for diabetes.  More often and at a younger age if you are overweight or have a high risk for diabetes. What should I know about preventing infection? Hepatitis B If you have a higher risk for hepatitis B, you should be screened for this virus. Talk with your health care provider to find out if you are at risk for hepatitis B infection. Hepatitis C Blood testing is recommended for:  Everyone born from [birth year] through 1965.  Anyone with known risk factors for hepatitis C. Sexually transmitted infections (STIs)  You should be screened each year for STIs, including gonorrhea and chlamydia, if: ? You are sexually active and are younger than 39 years of age. ? You are older than 39 years of age and your health care provider tells you that you are at risk for this type of infection. ? Your sexual activity has changed since you were last screened, and you are at increased risk for chlamydia or gonorrhea. Ask your health care provider if you are at risk.  Ask your health care provider about whether you are at high risk for HIV. Your health care provider may recommend a prescription medicine to help prevent HIV infection. If you choose to take medicine to prevent HIV, you should first get tested for HIV. You should then be tested every 3 months for as long as you are taking the medicine. Follow these instructions at home: Lifestyle  Do not use any products that contain nicotine or tobacco, such as cigarettes, e-cigarettes, and chewing tobacco. If you need help quitting,  ask your health care provider.  Do not use street drugs.  Do not share needles.  Ask your health care provider for help if you need support or information about quitting drugs. Alcohol use  Do not drink alcohol if your health care provider tells you not to drink.  If you drink alcohol: ? Limit how much you have to 0-2 drinks a day. ? Be aware of how much alcohol is in your drink. In the [place] one drink equals one 12  oz bottle of beer (355 mL), one 5 oz glass of wine (148 mL), or one 1 oz glass of hard liquor (44 mL). General instructions  Schedule regular health, dental, and eye exams.  Stay current with your vaccines.  Tell your health care provider if: ? You often feel depressed. ? You have ever been abused or do not feel safe at home. Summary  Adopting a healthy lifestyle and getting preventive care are important in promoting health and wellness.  Follow your health care provider's instructions about healthy diet, exercising, and getting tested or screened for diseases.  Follow your health care provider's instructions on monitoring your cholesterol and blood pressure. This information is not intended to replace advice given to you by your health care provider. Make sure you discuss any questions you have with your health care provider. Document Revised: 09/10/2018 Document Reviewed: 09/10/2018 Elsevier Patient Education  2020 [employer] Inc. Diabetes Basics  Diabetes (diabetes mellitus) is a long-term (chronic) disease. It occurs when the body does not properly use sugar (glucose) that is released from food after you eat. Diabetes may be caused by one or both of these problems:  Your pancreas does not make enough of a hormone called insulin.  Your body does not react in a normal way to insulin that it makes. Insulin lets sugars (glucose) go into cells in your body. This gives you energy. If you have diabetes, sugars cannot get into cells. This causes high blood sugar  (hyperglycemia). Follow these instructions at home: How is diabetes treated? You may need to take insulin or other diabetes medicines daily to keep your blood sugar in balance. Take your diabetes medicines every day as told by your doctor. List your diabetes medicines here: Diabetes medicines  Name of medicine: ______________________________ ? Amount (dose): _______________ Time (a.m./p.m.): _______________ Notes: ___________________________________  Name of medicine: ______________________________ ? Amount (dose): _______________ Time (a.m./p.m.): _______________ Notes: ___________________________________  Name of medicine: ______________________________ ? Amount (dose): _______________ Time (a.m./p.m.): _______________ Notes: ___________________________________ If you use insulin, you will learn how to give yourself insulin by injection. You may need to adjust the amount based on the food that you eat. List the types of insulin you use here: Insulin  Insulin type: ______________________________ ? Amount (dose): _______________ Time (a.m./p.m.): _______________ Notes: ___________________________________  Insulin type: ______________________________ ? Amount (dose): _______________ Time (a.m./p.m.): _______________ Notes: ___________________________________  Insulin type: ______________________________ ? Amount (dose): _______________ Time (a.m./p.m.): _______________ Notes: ___________________________________  Insulin type: ______________________________ ? Amount (dose): _______________ Time (a.m./p.m.): _______________ Notes: ___________________________________  Insulin type: ______________________________ ? Amount (dose): _______________ Time (a.m./p.m.): _______________ Notes: ___________________________________ How do I manage my blood sugar?  Check your blood sugar levels using a blood glucose monitor as directed by your doctor. Your doctor will set treatment goals for you.  Generally, you should have these blood sugar levels:  Before meals (preprandial): 80-130 mg/dL (1.6-1.04.4-7.2 mmol/L).  After meals (postprandial): below 180 mg/dL (10 mmol/L).  A1c level: less than 7%. Write down the times that you will check your blood sugar levels: Blood sugar checks  Time: _______________ Notes: ___________________________________  Time: _______________ Notes: ___________________________________  Time: _______________ Notes: ___________________________________  Time: _______________ Notes: ___________________________________  Time: _______________ Notes: ___________________________________  Time: _______________ Notes: ___________________________________  What do I need to know about low blood sugar? Low blood sugar is called hypoglycemia. This is when blood sugar is at or below 70 mg/dL (3.9 mmol/L). Symptoms may include:  Feeling: ? Hungry. ? Worried or nervous (anxious). ? Sweaty and clammy. ? Confused. ?  Dizzy. ? Sleepy. ? Sick to your stomach (nauseous).  Having: ? A fast heartbeat. ? A headache. ? A change in your vision. ? Tingling or no feeling (numbness) around the mouth, lips, or tongue. ? Jerky movements that you cannot control (seizure).  Having trouble with: ? Moving (coordination). ? Sleeping. ? Passing out (fainting). ? Getting upset easily (irritability). Treating low blood sugar To treat low blood sugar, eat or drink something sugary right away. If you can think clearly and swallow safely, follow the 15:15 rule:  Take 15 grams of a fast-acting carb (carbohydrate). Talk with your doctor about how much you should take.  Some fast-acting carbs are: ? Sugar tablets (glucose pills). Take 3-4 glucose pills. ? 6-8 pieces of hard candy. ? 4-6 oz (120-150 mL) of fruit juice. ? 4-6 oz (120-150 mL) of regular (not diet) soda. ? 1 Tbsp (15 mL) honey or sugar.  Check your blood sugar 15 minutes after you take the carb.  If your blood  sugar is still at or below 70 mg/dL (3.9 mmol/L), take 15 grams of a carb again.  If your blood sugar does not go above 70 mg/dL (3.9 mmol/L) after 3 tries, get help right away.  After your blood sugar goes back to normal, eat a meal or a snack within 1 hour. Treating very low blood sugar If your blood sugar is at or below 54 mg/dL (3 mmol/L), you have very low blood sugar (severe hypoglycemia). This is an emergency. Do not wait to see if the symptoms will go away. Get medical help right away. Call your local emergency services (911 in the [place] Do not drive yourself to the hospital. Questions to ask your health care provider  Do I need to meet with a diabetes educator?  What equipment will I need to care for myself at home?  What diabetes medicines do I need? When should I take them?  How often do I need to check my blood sugar?  What number can I call if I have questions?  When is my next doctor's visit?  Where can I find a support group for people with diabetes? Where to find more information  American Diabetes Association: www.diabetes.org  American Association of Diabetes Educators: www.diabeteseducator.org/patient-resources Contact a doctor if:  Your blood sugar is at or above 240 mg/dL (24.2 mmol/L) for 2 days in a row.  You have been sick or have had a fever for 2 days or more, and you are not getting better.  You have any of these problems for more than 6 hours: ? You cannot eat or drink. ? You feel sick to your stomach (nauseous). ? You throw up (vomit). ? You have watery poop (diarrhea). Get help right away if:  Your blood sugar is lower than 54 mg/dL (3 mmol/L).  You get confused.  You have trouble: ? Thinking clearly. ? Breathing. Summary  Diabetes (diabetes mellitus) is a long-term (chronic) disease. It occurs when the body does not properly use sugar (glucose) that is released from food after digestion.  Take insulin and diabetes medicines as  told.  Check your blood sugar every day, as often as told.  Keep all follow-up visits as told by your doctor. This is important. This information is not intended to replace advice given to you by your health care provider. Make sure you discuss any questions you have with your health care provider. Document Revised: 06/10/2019 Document Reviewed: 12/20/2017 Elsevier Patient Education  2020 [employer]. Carbohydrate Counting  for Diabetes Mellitus, Adult  Carbohydrate counting is a method of keeping track of how many carbohydrates you eat. Eating carbohydrates naturally increases the amount of sugar (glucose) in the blood. Counting how many carbohydrates you eat helps keep your blood glucose within normal limits, which helps you manage your diabetes (diabetes mellitus). It is important to know how many carbohydrates you can safely have in each meal. This is different for every person. A diet and nutrition specialist (registered dietitian) can help you make a meal plan and calculate how many carbohydrates you should have at each meal and snack. Carbohydrates are found in the following foods:  Grains, such as breads and cereals.  Dried beans and soy products.  Starchy vegetables, such as potatoes, peas, and corn.  Fruit and fruit juices.  Milk and yogurt.  Sweets and snack foods, such as cake, cookies, candy, chips, and soft drinks. How do I count carbohydrates? There are two ways to count carbohydrates in food. You can use either of the methods or a combination of both. Reading "Nutrition Facts" on packaged food The "Nutrition Facts" list is included on the labels of almost all packaged foods and beverages in the [place] It includes:  The serving size.  Information about nutrients in each serving, including the grams (g) of carbohydrate per serving. To use the "Nutrition Facts":  Decide how many servings you will have.  Multiply the number of servings by the number of carbohydrates  per serving.  The resulting number is the total amount of carbohydrates that you will be having. Learning standard serving sizes of other foods When you eat carbohydrate foods that are not packaged or do not include "Nutrition Facts" on the label, you need to measure the servings in order to count the amount of carbohydrates:  Measure the foods that you will eat with a food scale or measuring cup, if needed.  Decide how many standard-size servings you will eat.  Multiply the number of servings by 15. Most carbohydrate-rich foods have about 15 g of carbohydrates per serving. ? For example, if you eat 8 oz (170 g) of strawberries, you will have eaten 2 servings and 30 g of carbohydrates (2 servings x 15 g = 30 g).  For foods that have more than one food mixed, such as soups and casseroles, you must count the carbohydrates in each food that is included. The following list contains standard serving sizes of common carbohydrate-rich foods. Each of these servings has about 15 g of carbohydrates:   hamburger bun or  English muffin.   oz (15 mL) syrup.   oz (14 g) jelly.  1 slice of bread.  1 six-inch tortilla.  3 oz (85 g) cooked rice or pasta.  4 oz (113 g) cooked dried beans.  4 oz (113 g) starchy vegetable, such as peas, corn, or potatoes.  4 oz (113 g) hot cereal.  4 oz (113 g) mashed potatoes or  of a large baked potato.  4 oz (113 g) canned or frozen fruit.  4 oz (120 mL) fruit juice.  4-6 crackers.  6 chicken nuggets.  6 oz (170 g) unsweetened dry cereal.  6 oz (170 g) plain fat-free yogurt or yogurt sweetened with artificial sweeteners.  8 oz (240 mL) milk.  8 oz (170 g) fresh fruit or one small piece of fruit.  24 oz (680 g) popped popcorn. Example of carbohydrate counting Sample meal  3 oz (85 g) chicken breast.  6 oz (170 g) brown rice.  4 oz (113 g) corn.  8 oz (240 mL) milk.  8 oz (170 g) strawberries with sugar-free whipped  topping. Carbohydrate calculation 1. Identify the foods that contain carbohydrates: ? Rice. ? Corn. ? Milk. ? Strawberries. 2. Calculate how many servings you have of each food: ? 2 servings rice. ? 1 serving corn. ? 1 serving milk. ? 1 serving strawberries. 3. Multiply each number of servings by 15 g: ? 2 servings rice x 15 g = 30 g. ? 1 serving corn x 15 g = 15 g. ? 1 serving milk x 15 g = 15 g. ? 1 serving strawberries x 15 g = 15 g. 4. Add together all of the amounts to find the total grams of carbohydrates eaten: ? 30 g + 15 g + 15 g + 15 g = 75 g of carbohydrates total. Summary  Carbohydrate counting is a method of keeping track of how many carbohydrates you eat.  Eating carbohydrates naturally increases the amount of sugar (glucose) in the blood.  Counting how many carbohydrates you eat helps keep your blood glucose within normal limits, which helps you manage your diabetes.  A diet and nutrition specialist (registered dietitian) can help you make a meal plan and calculate how many carbohydrates you should have at each meal and snack. This information is not intended to replace advice given to you by your health care provider. Make sure you discuss any questions you have with your health care provider. Document Revised: 04/11/2017 Document Reviewed: 02/29/2016 Elsevier Patient Education  2020 Else[employer] Inc.   Calorie Counting for [employer] Calories are units of energy. Your body needs a certain amount of calories from food to keep you going throughout the day. When you eat more calories than your body needs, your body stores the extra calories as fat. When you eat fewer calories than your body needs, your body burns fat to get the energy it needs. Calorie counting means keeping track of how many calories you eat and drink each day. Calorie counting can be helpful if you need to lose weight. If you make sure to eat fewer calories than your body needs, you should lose  weight. Ask your health care provider what a healthy weight is for you. For calorie counting to work, you will need to eat the right number of calories in a day in order to lose a healthy amount of weight per week. A dietitian can help you determine how many calories you need in a day and will give you suggestions on how to reach your calorie goal.  A healthy amount of weight to lose per week is usually 1-2 lb (0.5-0.9 kg). This usually means that your daily calorie intake should be reduced by 500-750 calories.  Eating 1,200 - 1,500 calories per day can help most women lose weight.  Eating 1,500 - 1,800 calories per day can help most men lose weight. What is my plan? My goal is to have __________ calories per day. If I have this many calories per day, I should lose around __________ pounds per week. What do I need to know about calorie counting? In order to meet your daily calorie goal, you will need to:  Find out how many calories are in each food you would like to eat. Try to do this before you eat.  Decide how much of the food you plan to eat.  Write down what you ate and how many calories it had. Doing this is called keeping  a food log. To successfully lose weight, it is important to balance calorie counting with a healthy lifestyle that includes regular activity. Aim for 150 minutes of moderate exercise (such as walking) or 75 minutes of vigorous exercise (such as running) each week. Where do I find calorie information?  The number of calories in a food can be found on a Nutrition Facts label. If a food does not have a Nutrition Facts label, try to look up the calories online or ask your dietitian for help. Remember that calories are listed per serving. If you choose to have more than one serving of a food, you will have to multiply the calories per serving by the amount of servings you plan to eat. For example, the label on a package of bread might say that a serving size is 1 slice and  that there are 90 calories in a serving. If you eat 1 slice, you will have eaten 90 calories. If you eat 2 slices, you will have eaten 180 calories. How do I keep a food log? Immediately after each meal, record the following information in your food log:  What you ate. Don't forget to include toppings, sauces, and other extras on the food.  How much you ate. This can be measured in cups, ounces, or number of items.  How many calories each food and drink had.  The total number of calories in the meal. Keep your food log near you, such as in a small notebook in your pocket, or use a mobile app or website. Some programs will calculate calories for you and show you how many calories you have left for the day to meet your goal. What are some calorie counting tips?   Use your calories on foods and drinks that will fill you up and not leave you hungry: ? Some examples of foods that fill you up are nuts and nut butters, vegetables, lean proteins, and high-fiber foods like whole grains. High-fiber foods are foods with more than 5 g fiber per serving. ? Drinks such as sodas, specialty coffee drinks, alcohol, and juices have a lot of calories, yet do not fill you up.  Eat nutritious foods and avoid empty calories. Empty calories are calories you get from foods or beverages that do not have many vitamins or protein, such as candy, sweets, and soda. It is better to have a nutritious high-calorie food (such as an avocado) than a food with few nutrients (such as a bag of chips).  Know how many calories are in the foods you eat most often. This will help you calculate calorie counts faster.  Pay attention to calories in drinks. Low-calorie drinks include water and unsweetened drinks.  Pay attention to nutrition labels for "low fat" or "fat free" foods. These foods sometimes have the same amount of calories or more calories than the full fat versions. They also often have added sugar, starch, or salt, to  make up for flavor that was removed with the fat.  Find a way of tracking calories that works for you. Get creative. Try different apps or programs if writing down calories does not work for you. What are some portion control tips?  Know how many calories are in a serving. This will help you know how many servings of a certain food you can have.  Use a measuring cup to measure serving sizes. You could also try weighing out portions on a kitchen scale. With time, you will be able to estimate serving  sizes for some foods.  Take some time to put servings of different foods on your favorite plates, bowls, and cups so you know what a serving looks like.  Try not to eat straight from a bag or box. Doing this can lead to overeating. Put the amount you would like to eat in a cup or on a plate to make sure you are eating the right portion.  Use smaller plates, glasses, and bowls to prevent overeating.  Try not to multitask (for example, watch TV or use your computer) while eating. If it is time to eat, sit down at a table and enjoy your food. This will help you to know when you are full. It will also help you to be aware of what you are eating and how much you are eating. What are tips for following this plan? Reading food labels  Check the calorie count compared to the serving size. The serving size may be smaller than what you are used to eating.  Check the source of the calories. Make sure the food you are eating is high in vitamins and protein and low in saturated and trans fats. Shopping  Read nutrition labels while you shop. This will help you make healthy decisions before you decide to purchase your food.  Make a grocery list and stick to it. Cooking  Try to cook your favorite foods in a healthier way. For example, try baking instead of frying.  Use low-fat dairy products. Meal planning  Use more fruits and vegetables. Half of your plate should be fruits and vegetables.  Include  lean proteins like poultry and fish. How do I count calories when eating out?  Ask for smaller portion sizes.  Consider sharing an entree and sides instead of getting your own entree.  If you get your own entree, eat only half. Ask for a box at the beginning of your meal and put the rest of your entree in it so you are not tempted to eat it.  If calories are listed on the menu, choose the lower calorie options.  Choose dishes that include vegetables, fruits, whole grains, low-fat dairy products, and lean protein.  Choose items that are boiled, broiled, grilled, or steamed. Stay away from items that are buttered, battered, fried, or served with cream sauce. Items labeled "crispy" are usually fried, unless stated otherwise.  Choose water, low-fat milk, unsweetened iced tea, or other drinks without added sugar. If you want an alcoholic beverage, choose a lower calorie option such as a glass of wine or light beer.  Ask for dressings, sauces, and syrups on the side. These are usually high in calories, so you should limit the amount you eat.  If you want a salad, choose a garden salad and ask for grilled meats. Avoid extra toppings like bacon, cheese, or fried items. Ask for the dressing on the side, or ask for olive oil and vinegar or lemon to use as dressing.  Estimate how many servings of a food you are given. For example, a serving of cooked rice is  cup or about the size of half a baseball. Knowing serving sizes will help you be aware of how much food you are eating at restaurants. The list below tells you how big or small some common portion sizes are based on everyday objects: ? 1 oz--4 stacked dice. ? 3 oz--1 deck of cards. ? 1 tsp--1 die. ? 1 Tbsp-- a ping-pong ball. ? 2 Tbsp--1 ping-pong ball. ?  cup--  baseball. ? 1 cup--1 baseball. Summary  Calorie counting means keeping track of how many calories you eat and drink each day. If you eat fewer calories than your body needs, you  should lose weight.  A healthy amount of weight to lose per week is usually 1-2 lb (0.5-0.9 kg). This usually means reducing your daily calorie intake by 500-750 calories.  The number of calories in a food can be found on a Nutrition Facts label. If a food does not have a Nutrition Facts label, try to look up the calories online or ask your dietitian for help.  Use your calories on foods and drinks that will fill you up, and not on foods and drinks that will leave you hungry.  Use smaller plates, glasses, and bowls to prevent overeating. This information is not intended to replace advice given to you by your health care provider. Make sure you discuss any questions you have with your health care provider. Document Revised: 06/06/2018 Document Reviewed: 08/17/2016 [employer] Patient Education  2020 [employer]   Fat and Cholesterol Restricted Eating Plan Getting too much fat and cholesterol in your diet may cause health problems. Choosing the right foods helps keep your fat and cholesterol at normal levels. This can keep you from getting certain diseases. Your doctor may recommend an eating plan that includes:  Total fat: ______% or less of total calories a day.  Saturated fat: ______% or less of total calories a day.  Cholesterol: less than _________mg a day.  Fiber: ______g a day. What are tips for following this plan? Meal planning  At meals, divide your plate into four equal parts: ? Fill one-half of your plate with vegetables and green salads. ? Fill one-fourth of your plate with whole grains. ? Fill one-fourth of your plate with low-fat (lean) protein foods.  Eat fish that is high in omega-3 fats at least two times a week. This includes mackerel, tuna, sardines, and salmon.  Eat foods that are high in fiber, such as whole grains, beans, apples, broccoli, carrots, peas, and barley. General tips   Work with your doctor to lose weight if you need to.  Avoid: ? Foods with  added sugar. ? Fried foods. ? Foods with partially hydrogenated oils.  Limit alcohol intake to no more than 1 drink a day for nonpregnant women and 2 drinks a day for men. One drink equals 12 oz of beer, 5 oz of wine, or 1 oz of hard liquor. Reading food labels  Check food labels for: ? Trans fats. ? Partially hydrogenated oils. ? Saturated fat (g) in each serving. ? Cholesterol (mg) in each serving. ? Fiber (g) in each serving.  Choose foods with healthy fats, such as: ? Monounsaturated fats. ? Polyunsaturated fats. ? Omega-3 fats.  Choose grain products that have whole grains. Look for the word "whole" as the first word in the ingredient list. Cooking  Cook foods using low-fat methods. These include baking, boiling, grilling, and broiling.  Eat more home-cooked foods. Eat at restaurants and buffets less often.  Avoid cooking using saturated fats, such as butter, cream, palm oil, palm kernel oil, and coconut oil. Recommended foods  Fruits  All fresh, canned (in natural juice), or frozen fruits. Vegetables  Fresh or frozen vegetables (raw, steamed, roasted, or grilled). Green salads. Grains  Whole grains, such as whole wheat or whole grain breads, crackers, cereals, and pasta. Unsweetened oatmeal, bulgur, barley, quinoa, or brown rice. Corn or whole wheat flour tortillas. Meats and  other protein foods  Ground beef (85% or leaner), grass-fed beef, or beef trimmed of fat. Skinless chicken or [place]. Ground chicken or [place]. Pork trimmed of fat. All fish and seafood. Egg whites. Dried beans, peas, or lentils. Unsalted nuts or seeds. Unsalted canned beans. Nut butters without added sugar or oil. Dairy  Low-fat or nonfat dairy products, such as skim or 1% milk, 2% or reduced-fat cheeses, low-fat and fat-free ricotta or cottage cheese, or plain low-fat and nonfat yogurt. Fats and oils  Tub margarine without trans fats. Light or reduced-fat mayonnaise and salad dressings.  Avocado. Olive, canola, sesame, or safflower oils. The items listed above may not be a complete list of foods and beverages you can eat. Contact a dietitian for more information. Foods to avoid Fruits  Canned fruit in heavy syrup. Fruit in cream or butter sauce. Fried fruit. Vegetables  Vegetables cooked in cheese, cream, or butter sauce. Fried vegetables. Grains  White bread. White pasta. White rice. Cornbread. Bagels, pastries, and croissants. Crackers and snack foods that contain trans fat and hydrogenated oils. Meats and other protein foods  Fatty cuts of meat. Ribs, chicken wings, bacon, sausage, bologna, salami, chitterlings, fatback, hot dogs, bratwurst, and packaged lunch meats. Liver and organ meats. Whole eggs and egg yolks. Chicken and [place] with skin. Fried meat. Dairy  Whole or 2% milk, cream, half-and-half, and cream cheese. Whole milk cheeses. Whole-fat or sweetened yogurt. Full-fat cheeses. Nondairy creamers and whipped toppings. Processed cheese, cheese spreads, and cheese curds. Beverages  Alcohol. Sugar-sweetened drinks such as sodas, lemonade, and fruit drinks. Fats and oils  Butter, stick margarine, lard, shortening, ghee, or bacon fat. Coconut, palm kernel, and palm oils. Sweets and desserts  Corn syrup, sugars, honey, and molasses. Candy. Jam and jelly. Syrup. Sweetened cereals. Cookies, pies, cakes, donuts, muffins, and ice cream. The items listed above may not be a complete list of foods and beverages you should avoid. Contact a dietitian for more information. Summary  Choosing the right foods helps keep your fat and cholesterol at normal levels. This can keep you from getting certain diseases.  At meals, fill one-half of your plate with vegetables and green salads.  Eat high-fiber foods, like whole grains, beans, apples, carrots, peas, and barley.  Limit added sugar, saturated fats, alcohol, and fried foods. This information is not intended to replace  advice given to you by your health care provider. Make sure you discuss any questions you have with your health care provider. Document Revised: 05/21/2018 Document Reviewed: 06/04/2017 [employer] Patient Education  2020 [employer].

## 2020-08-10 NOTE — Progress Notes (Signed)
New patient visit   Patient: Lee Maldonado   DOB: 1981-05-14   39 y.o. Male  MRN: [id number] Visit Date: 08/10/2020  Today's healthcare provider: Marcille Buffy, FNP   Subjective    Lee Maldonado is a 39 y.o. male who presents today as a new patient to establish care.  HPI  Patient presents in office today to establish care, patient denies having a previous PCP and states that he frequently was seen at urgent cares. Patient states that he feels well today and has no concerns to address but would like refill for medications for diabetes and hypertension. Patient reports that he follows a well balanced diet, stays active 3x a week with cardio and reports that he is sleeping well.   on nutrisystem for diabetics he reports and he stopped all diabetes medication and has not been taking any medications.   he is type II diabetic, he has bee off his medications, he is not checking blood sugars at home. History of pancreatitis. He reports " its like my body just rejects the insulin now".  covid vaccine card shows 1 st dose 12/19/19 and second dose was scheduled for  01/09/20 Pfzier, no  lot number on 2nd dose, will have looked up in [place] and it was given.  He is ok for booster - today. Denies any recent illness or exposure and denies any reactions to his 1st and 2nd vaccine.   Patient  denies any fever, body aches,chills, rash, chest pain, shortness of breath, nausea, vomiting, or diarrhea.  Denies dizziness, lightheadedness, pre syncopal or syncopal episodes.    Past Medical History:  Diagnosis Date  . Diabetes mellitus   . Hypercholesteremia   . Hypertension   . Pancreatitis    History reviewed. No pertinent surgical history. Family Status  Relation Name Status  . Mother  Alive  . Father  Deceased   Family History  Problem Relation Age of Onset  . Diabetes Mother   . Diabetes Father    Social History   Socioeconomic History  . Marital status: Married    Spouse name: Not  on file  . Number of children: Not on file  . Years of education: Not on file  . Highest education level: Not on file  Occupational History  . Not on file  Tobacco Use  . Smoking status: Former Smoker    Types: Cigarettes    Quit date: 03/02/2012    Years since quitting: 8.4  . Smokeless tobacco: Never Used  Substance and Sexual Activity  . Alcohol use: Yes  . Drug use: No  . Sexual activity: Not on file  Other Topics Concern  . Not on file  Social History Narrative  . Not on file   Social Determinants of Health   Financial Resource Strain:   . Difficulty of Paying Living Expenses: Not on file  Food Insecurity:   . Worried About Charity fundraiser in the Last Year: Not on file  . Ran Out of Food in the Last Year: Not on file  Transportation Needs:   . Lack of Transportation (Medical): Not on file  . Lack of Transportation (Non-Medical): Not on file  Physical Activity:   . Days of Exercise per Week: Not on file  . Minutes of Exercise per Session: Not on file  Stress:   . Feeling of Stress : Not on file  Social Connections:   . Frequency of Communication with Friends and Family: Not on file  .  Frequency of Social Gatherings with Friends and Family: Not on file  . Attends Religious Services: Not on file  . Active Member of Clubs or Organizations: Not on file  . Attends Archivist Meetings: Not on file  . Marital Status: Not on file   Outpatient Medications Prior to Visit  Medication Sig  . [DISCONTINUED] atorvastatin (LIPITOR) 10 MG tablet Take 1 tablet (10 mg total) by mouth daily. (Patient not taking: Reported on 08/10/2020)  . [DISCONTINUED] fluticasone (FLONASE) 50 MCG/ACT nasal spray Place 2 sprays into both nostrils at bedtime. (Patient not taking: Reported on 05/14/2020)  . [DISCONTINUED] insulin aspart (NOVOLOG) 100 UNIT/ML injection Inject 10 Units into the skin 3 (three) times daily before meals. Per sliding scale (Patient not taking: Reported on  08/10/2020)  . [DISCONTINUED] Insulin Detemir (LEVEMIR FLEXTOUCH) 100 UNIT/ML Pen Inject 30 Units into the skin daily at 10 pm. (Patient not taking: Reported on 05/14/2020)  . [DISCONTINUED] insulin lispro (HUMALOG) 100 UNIT/ML KiwkPen Inject 10-12 units with each meal. (Patient not taking: Reported on 05/14/2020)  . [DISCONTINUED] JARDIANCE 25 MG TABS tablet TAKE 1 TABLET BY MOUTH DAILY (Patient not taking: Reported on 05/14/2020)  . [DISCONTINUED] LEVEMIR FLEXTOUCH 100 UNIT/ML Pen INJECT 30 UNITS UNDER THE SKIN DAILY AT 10 PM( TO REPLACE TRESIBA) (Patient not taking: Reported on 05/14/2020)  . [DISCONTINUED] lisinopril (PRINIVIL,ZESTRIL) 20 MG tablet TAKE 1 TABLET BY MOUTH DAILY (Patient not taking: Reported on 08/10/2020)  . [DISCONTINUED] metFORMIN (GLUCOPHAGE) 500 MG tablet Take 500 mg by mouth 2 (two) times daily. (Patient not taking: Reported on 08/10/2020)  . [DISCONTINUED] NOVOLOG FLEXPEN 100 UNIT/ML FlexPen INJECT 10 TO 12 UNITS WITH EACH MEAL AS DIRECTED (Patient not taking: Reported on 05/14/2020)  . [DISCONTINUED] ondansetron (ZOFRAN ODT) 4 MG disintegrating tablet Take 1 tablet (4 mg total) by mouth every 8 (eight) hours as needed for nausea or vomiting. (Patient not taking: Reported on 05/14/2020)  . [DISCONTINUED] rosuvastatin (CRESTOR) 10 MG tablet Take 10 mg by mouth daily. (Patient not taking: Reported on 08/10/2020)  . [DISCONTINUED] TRESIBA FLEXTOUCH 100 UNIT/ML SOPN INJECT 40 UNITS INTO THE SKIN DAILY AS DIRECTED (Patient not taking: Reported on 05/14/2020)   No facility-administered medications prior to visit.   Allergies  Allergen Reactions  . Lantus [Insulin Glargine] Swelling    Immunization History  Administered Date(s) Administered  . Influenza Split 06/21/2012  . Influenza,inj,Quad PF,6+ Mos 06/24/2014  . PFIZER SARS-COV-2 Vaccination 08/10/2020  . Pneumococcal Polysaccharide-23 03/05/2012    Health Maintenance  Topic Date Due  . Hepatitis C Screening  Never done    . HIV Screening  Never done  . TETANUS/TDAP  Never done  . OPHTHALMOLOGY EXAM  11/26/2012  . FOOT EXAM  12/02/2015  . HEMOGLOBIN A1C  05/28/2016  . INFLUENZA VACCINE  05/01/2020  . COVID-19 Vaccine (2 - Pfizer 2-dose series) 08/31/2020  . PNEUMOCOCCAL POLYSACCHARIDE VACCINE AGE 22-64 HIGH RISK  Completed    Patient Care Team: Doreen Beam, FNP as PCP - General (Family Medicine)  Review of Systems  Constitutional: Negative.   HENT: Negative.   Respiratory: Negative.   Cardiovascular: Negative.   Endocrine: Negative.   Genitourinary: Negative.   Musculoskeletal: Negative.   Skin: Negative.   Neurological: Negative.  Negative for dizziness.  Psychiatric/Behavioral: Negative.   All other systems reviewed and are negative.     Objective    BP (!) 165/91   Pulse 82   Temp 98.4 F (36.9 C) (Oral)   Resp 16  Ht 5' 8" (1.727 m) Comment: patient reports  Wt 227 lb 9.6 oz (103.2 kg)   SpO2 100%   BMI 34.61 kg/m  Physical Exam Vitals and nursing note reviewed.  Constitutional:      General: He is not in acute distress.    Appearance: Normal appearance. He is well-developed. He is obese. He is not ill-appearing, toxic-appearing or diaphoretic.  HENT:     Head: Normocephalic and atraumatic.     Right Ear: Hearing, tympanic membrane, ear canal and external ear normal.     Left Ear: Hearing, tympanic membrane, ear canal and external ear normal.     Nose: Nose normal.     Mouth/Throat:     Pharynx: Uvula midline. No oropharyngeal exudate.  Eyes:     General: Lids are normal. No scleral icterus.       Right eye: No discharge.        Left eye: No discharge.     Conjunctiva/sclera: Conjunctivae normal.     Pupils: Pupils are equal, round, and reactive to light.  Neck:     Thyroid: No thyromegaly.     Vascular: Normal carotid pulses. No carotid bruit, hepatojugular reflux or JVD.     Trachea: Trachea and phonation normal. No tracheal tenderness or tracheal deviation.      Meningeal: Brudzinski's sign absent.  Cardiovascular:     Rate and Rhythm: Normal rate and regular rhythm.     Pulses: Normal pulses.     Heart sounds: Normal heart sounds, S1 normal and S2 normal. Heart sounds not distant. No murmur heard.  No friction rub. No gallop.   Pulmonary:     Effort: Pulmonary effort is normal. No accessory muscle usage or respiratory distress.     Breath sounds: Normal breath sounds. No stridor. No wheezing or rales.  Chest:     Chest wall: No tenderness.  Abdominal:     General: Bowel sounds are normal. There is no distension.     Palpations: Abdomen is soft. There is no mass.     Tenderness: There is no abdominal tenderness. There is no guarding or rebound.     Hernia: No hernia is present.  Musculoskeletal:        General: No tenderness or deformity. Normal range of motion.     Cervical back: Full passive range of motion without pain, normal range of motion and neck supple.     Comments: Patient moves on and off of exam table and in room without difficulty. Gait is normal in hall and in room. Patient is oriented to person place time and situation. Patient answers questions appropriately and engages in conversation.   Lymphadenopathy:     Head:     Right side of head: No submental, submandibular, tonsillar, preauricular, posterior auricular or occipital adenopathy.     Left side of head: No submental, submandibular, tonsillar, preauricular, posterior auricular or occipital adenopathy.     Cervical: No cervical adenopathy.  Skin:    General: Skin is warm and dry.     Capillary Refill: Capillary refill takes less than 2 seconds.     Coloration: Skin is not pale.     Findings: No erythema or rash.     Nails: There is no clubbing.  Neurological:     General: No focal deficit present.     Mental Status: He is alert and oriented to person, place, and time.     GCS: GCS eye subscore is 4. GCS verbal subscore is 5.  GCS motor subscore is 6.     Cranial  Nerves: No cranial nerve deficit.     Sensory: No sensory deficit.     Motor: No weakness or abnormal muscle tone.     Coordination: Coordination normal.     Gait: Gait normal.     Deep Tendon Reflexes: Reflexes are normal and symmetric. Reflexes normal.  Psychiatric:        Mood and Affect: Mood normal.        Speech: Speech normal.        Behavior: Behavior normal.        Thought Content: Thought content normal.        Judgment: Judgment normal.     Depression Screen PHQ 2/9 Scores 08/10/2020 12/31/2014 12/02/2014 06/24/2014  PHQ - 2 Score 0 0 0 0  PHQ- 9 Score 0 - - -   No results found for any visits on 08/10/20.  Assessment & Plan     Uncontrolled type 2 diabetes mellitus with hyperglycemia (HCC) - Plan: Comprehensive metabolic panel, Lipid panel, TSH, HgB A1c, blood glucose meter kit and supplies, lisinopril (ZESTRIL) 20 MG tablet, metFORMIN (GLUCOPHAGE-XR) 500 MG 24 hr tablet, rosuvastatin (CRESTOR) 10 MG tablet, insulin degludec (TRESIBA FLEXTOUCH) 100 UNIT/ML FlexTouch Pen, SARS-CoV-2 Antibody, IgM  Screening for blood or protein in urine - Plan: CANCELED: POCT urinalysis dipstick  Hyperlipidemia, unspecified hyperlipidemia type - Plan: CBC with Differential/Platelet, Lipid panel, SARS-CoV-2 Antibody, IgM  Hypertension associated with diabetes ([place]) - Plan: SARS-CoV-2 Antibody, IgM  Encounter for immunization - Plan: Pfizer SARS-COV-2 Vaccine  Body mass index (BMI) of 34.0-34.9 in adult  Meds ordered this encounter  Medications  . blood glucose meter kit and supplies    Sig: Dispense based on patient and insurance preference. Use up to four times daily as directed. (FOR ICD-10 E10.9, E11.9). Check blood sugar daily fasting in am and before each meal, QID.    Dispense:  1 each    Refill:  0    Order Specific Question:   Number of strips    Answer:   180    Order Specific Question:   Number of lancets    Answer:   180  . lisinopril (ZESTRIL) 20 MG tablet    Sig: Take  1 tablet (20 mg total) by mouth daily.    Dispense:  90 tablet    Refill:  0    PATIENT NEEDS OFFICE VISIT FOR ADDITIONAL REFILLS  . metFORMIN (GLUCOPHAGE-XR) 500 MG 24 hr tablet    Sig: Take two tablets by mouth in the morning by mouth daily.    Dispense:  180 tablet    Refill:  0  . rosuvastatin (CRESTOR) 10 MG tablet    Sig: Take 1 tablet (10 mg total) by mouth daily.    Dispense:  90 tablet    Refill:  0  . insulin degludec (TRESIBA FLEXTOUCH) 100 UNIT/ML FlexTouch Pen    Sig: INJECT 40 UNITS INTO THE SKIN DAILY AS DIRECTED    Dispense:  15 mL    Refill:  0    No further refills until seen in the office  he reports he was taking [place] as above without any difficulty and Metformin 500 mg BID daily, would like to change to XR due to GI upset with metformin, will try.  He will discontinue the metformin 500 mg and take as directed.  Likely needs endocrinology referral, will wait on labs.   Requests covid booster today ok to do has  been 7 months. Discussed possible side effects and after care.   Red Flags discussed. The patient was given clear instructions to go to ER or return to medical center if any red flags develop, symptoms do not improve, worsen or new problems develop. They verbalized understanding.  Addressed acute and or chronic medical problems today requiring 45 minutes reviewing patients medical record,labs, counseling patient regarding patient's conditions, any medications, answering questions regarding health, and coordination of care as needed. After visit summary patient given copy and reviewed.    POCT urine at next office vist for screening protein and blood, urine micro albumin and diabetic for exam/ health maintenance / review labs and CPE needed.  Return in about 1 month (around 09/09/2020), or if symptoms worsen or fail to improve, for at any time for any worsening symptoms, Go to Emergency room/ urgent care if worse.      Marcille Buffy, [place] [phone] (phone) [phone] (fax)  [place]

## 2020-08-11 LAB — COMPREHENSIVE METABOLIC PANEL
ALT: 46 IU/L — ABNORMAL HIGH (ref 0–44)
AST: 29 IU/L (ref 0–40)
Albumin/Globulin Ratio: 1.6 (ref 1.2–2.2)
Albumin: 4.9 g/dL (ref 4.0–5.0)
Alkaline Phosphatase: 81 IU/L (ref 44–121)
BUN/Creatinine Ratio: 11 (ref 9–20)
BUN: 10 mg/dL (ref 6–20)
Bilirubin Total: 0.5 mg/dL (ref 0.0–1.2)
CO2: 23 mmol/L (ref 20–29)
Calcium: 9.7 mg/dL (ref 8.7–10.2)
Chloride: 99 mmol/L (ref 96–106)
Creatinine, Ser: 0.87 mg/dL (ref 0.76–1.27)
GFR calc Af Amer: 126 mL/min/{1.73_m2} (ref >59)
GFR calc non Af Amer: 109 mL/min/{1.73_m2} (ref >59)
Globulin, Total: 3 g/dL (ref 1.5–4.5)
Glucose: 257 mg/dL — ABNORMAL HIGH (ref 65–99)
Potassium: 4.6 mmol/L (ref 3.5–5.2)
Sodium: 137 mmol/L (ref 134–144)
Total Protein: 7.9 g/dL (ref 6.0–8.5)

## 2020-08-11 LAB — CBC WITH DIFFERENTIAL/PLATELET
Basophils Absolute: 0.1 10*3/uL (ref 0.0–0.2)
Basos: 1 %
EOS (ABSOLUTE): 0.2 10*3/uL (ref 0.0–0.4)
Eos: 4 %
Hematocrit: 44.6 % (ref 37.5–51.0)
Hemoglobin: 14.8 g/dL (ref 13.0–17.7)
Immature Grans (Abs): 0 10*3/uL (ref 0.0–0.1)
Immature Granulocytes: 0 %
Lymphocytes Absolute: 1.9 10*3/uL (ref 0.7–3.1)
Lymphs: 34 %
MCH: 29.1 pg (ref 26.6–33.0)
MCHC: 33.2 g/dL (ref 31.5–35.7)
MCV: 88 fL (ref 79–97)
Monocytes Absolute: 0.5 10*3/uL (ref 0.1–0.9)
Monocytes: 9 %
Neutrophils Absolute: 2.9 10*3/uL (ref 1.4–7.0)
Neutrophils: 52 %
Platelets: 271 10*3/uL (ref 150–450)
RBC: 5.08 x10E6/uL (ref 4.14–5.80)
RDW: 12.3 % (ref 11.6–15.4)
WBC: 5.6 10*3/uL (ref 3.4–10.8)

## 2020-08-11 LAB — LIPID PANEL
Chol/HDL Ratio: 3.8 ratio (ref 0.0–5.0)
Cholesterol, Total: 154 mg/dL (ref 100–199)
HDL: 41 mg/dL (ref >39)
LDL Chol Calc (NIH): 95 mg/dL (ref 0–99)
Triglycerides: 97 mg/dL (ref 0–149)
VLDL Cholesterol Cal: 18 mg/dL (ref 5–40)

## 2020-08-11 LAB — SARS-COV-2 ANTIBODY, IGM: SARS-CoV-2 Antibody, IgM: NEGATIVE

## 2020-08-11 LAB — HEMOGLOBIN A1C
Est. average glucose Bld gHb Est-mCnc: 303 mg/dL
Hgb A1c MFr Bld: 12.2 % — ABNORMAL HIGH (ref 4.8–5.6)

## 2020-08-11 LAB — TSH: TSH: 0.789 u[IU]/mL (ref 0.450–4.500)

## 2020-08-11 NOTE — Progress Notes (Signed)
CBC within normal..  CMP mild elevation in ALT , avoid alcohol or excessive tylenol . Glucose uncontrolled 257, hemoglobin A1C 12.2 uncontrolled, he has been off all medications, Meformin XR 1000 mg po qd and [place]  was restarted at office visit, he should monitor diet and increase healthy exercise. He will need a endocrinology referral placed to his location of choice for uncontrolled diabetes diet. Will have endocrinologist manage diabetes.  TSH for thyroid stable and within normal limits.   Cholesterol within normal limits.  Recommend repeat labs Hemoglobin A1C, CMP in 3 months - he will likely see endocrinologist by then.   His SARS IGM for travel covid test still pending.

## 2020-08-11 NOTE — Progress Notes (Signed)
Negative covid for acute infection.

## 2020-08-18 ENCOUNTER — Telehealth: Payer: Self-pay | Admitting: Adult Health

## 2020-08-18 DIAGNOSIS — E1165 Type 2 diabetes mellitus with hyperglycemia: Secondary | ICD-10-CM

## 2020-08-18 NOTE — Telephone Encounter (Signed)
Requested medication (s) are due for refill today: no  Requested medication (s) are on the active medication list: yes  Last refill:  08/10/20. Note on prescription states no further refills until seen in the office  Future visit scheduled: yes  Notes to clinic:  Please review for refill. Patient requesting prescriptions to be sent to pharmacy in [place], [place]. Patient requesting Novolog Flexpen refill which is not on current med list    Requested Prescriptions  Pending Prescriptions Disp Refills   insulin degludec (TRESIBA FLEXTOUCH) 100 UNIT/ML FlexTouch Pen 15 mL 0    Sig: INJECT 40 UNITS INTO THE SKIN DAILY AS DIRECTED      Endocrinology:  Diabetes - Insulins Failed - 08/18/2020  3:57 PM      Failed - HBA1C is between 0 and 7.9 and within 180 days    Hgb A1c MFr Bld  Date Value Ref Range Status  08/10/2020 12.2 (H) 4.8 - 5.6 % Final    Comment:             Prediabetes: 5.7 - 6.4          Diabetes: >6.4          Glycemic control for adults with diabetes: <7.0           Passed - Valid encounter within last 6 months    Recent Outpatient Visits           1 week ago Uncontrolled type 2 diabetes mellitus with hyperglycemia (HCC)   [facility] Flinchum, Eula Fried, FNP   5 years ago Allergic conjunctivitis, right   [facility], Thao P, DO   5 years ago Diabetes type 2, uncontrolled   [facility], Kenyon Ana, MD   6 years ago Type II or unspecified type diabetes mellitus without mention of complication, not stated as uncontrolled   [facility], Kenyon Ana, MD   7 years ago Cough   [facility], Levell July, MD       Future Appointments             In 4 weeks Flinchum, Eula Fried, FNP [facility]

## 2020-08-18 NOTE — Telephone Encounter (Signed)
See refill request for Novolog Flexpen 100unit/ml. Not on current med list.

## 2020-08-18 NOTE — Telephone Encounter (Signed)
Medication Refill - Medication: insulin degludec (TRESIBA FLEXTOUCH) 100 UNIT/ML FlexTouch Pen NOVOLOG FLEXPEN 100 UNIT/ML FlexPen [[id number]]    Has the patient contacted their pharmacy? Yes.   (Agent: If no, request that the patient contact the pharmacy for the refill.) (Agent: If yes, when and what did the pharmacy advise?)  Preferred Pharmacy (with phone number or street name):  [id number] [place], [place] - [address]  [address] [place] [address]  Phone: [phone] Fax: [phone]     Agent: Please be advised that RX refills may take up to 3 business days. We ask that you follow-up with your pharmacy.

## 2020-08-22 NOTE — Telephone Encounter (Signed)
PT calling back to F/UP pleas advise

## 2020-08-23 ENCOUNTER — Telehealth: Payer: Self-pay

## 2020-08-23 NOTE — Telephone Encounter (Signed)
Please review. Thanks!  

## 2020-08-23 NOTE — Telephone Encounter (Signed)
I will contact patient if you like to see if he can see someone over there until he returns back to state. KW

## 2020-08-23 NOTE — Telephone Encounter (Signed)
Patient called back, best that he be seen since he is in [place], for a bridge until he returns to [place] for follow up.

## 2020-08-23 NOTE — Telephone Encounter (Signed)
Patient states tjere is no one he can see im [place], he states pharmacy there has all information and medication ready he stating they need [place] to authorize for Rx to be generic. KW

## 2020-08-23 NOTE — Telephone Encounter (Signed)
Yes, please do, I did not send those.

## 2020-08-23 NOTE — Telephone Encounter (Signed)
Copied from CRM [id number]. Topic: General - Other >> Aug 22, 2020  5:02 PM Dalphine Handing A wrote: Pharmacy stated that received requests for 2 medications that are not covered by patients insurance and recommened alternatives to medication be sent over that are covered.  Instead of Novolog, humalog is covered. Instead of traceba, lantus or tujeo is covered. Please advise  [id number] [place], [place] - [address]  Phone:  [phone] Fax:  [phone]

## 2020-08-24 ENCOUNTER — Other Ambulatory Visit: Payer: Self-pay | Admitting: Adult Health

## 2020-08-24 DIAGNOSIS — E1165 Type 2 diabetes mellitus with hyperglycemia: Secondary | ICD-10-CM

## 2020-08-24 MED ORDER — TRESIBA FLEXTOUCH 100 UNIT/ML ~~LOC~~ SOPN: PEN_INJECTOR | SUBCUTANEOUS | 0 refills | Status: DC

## 2020-08-24 NOTE — Telephone Encounter (Signed)
Ok to authorize generic., if his pharmacy here transferred there that is fine.

## 2020-08-24 NOTE — Progress Notes (Signed)
Meds ordered this encounter  Medications  . insulin degludec (TRESIBA FLEXTOUCH) 100 UNIT/ML FlexTouch Pen    Sig: INJECT 40 UNITS INTO THE SKIN DAILY AS DIRECTED    Dispense:  15 mL    Refill:  0    No further refills until seen in the office    Orders Placed This Encounter  Procedures  . Ambulatory referral to Endocrinology    Referral Priority:   Routine    Referral Type:   Consultation    Referral Reason:   Specialty Services Required    Number of Visits Requested:   1

## 2020-08-24 NOTE — Telephone Encounter (Signed)
Please review patient requesting Novolog Flex pen this is not on med list. Lee Maldonado

## 2020-08-24 NOTE — Telephone Encounter (Signed)
Meds ordered this encounter  Medications   insulin degludec (TRESIBA FLEXTOUCH) 100 UNIT/ML FlexTouch Pen    Sig: INJECT 40 UNITS INTO THE SKIN DAILY AS DIRECTED    Dispense:  15 mL    Refill:  0    No further refills until seen in the office  Sent the above.  No refill on Novolog Flex pen given, have he has not taken since 2017.  Have also placed referral for endocrinology.  He has metformin, if he has out of control blood sugars while on vacation in [place] my medical advise is to be seen and evaluated there immediately.

## 2020-08-31 ENCOUNTER — Other Ambulatory Visit: Payer: Self-pay | Admitting: Adult Health

## 2020-08-31 DIAGNOSIS — E1165 Type 2 diabetes mellitus with hyperglycemia: Secondary | ICD-10-CM

## 2020-08-31 NOTE — Telephone Encounter (Signed)
Requested medication (s) are due for refill today: Yes  Requested medication (s) are on the active medication list: Yes  Last refill:  08/24/20 (not accepted by insurance, will need an alternative sent in)  Future visit scheduled: Yes  Notes to clinic:  Unable to refill per protocol, will need an alternative sent to pharmacy due to insurance will not cover     Requested Prescriptions  Pending Prescriptions Disp Refills   insulin degludec (TRESIBA FLEXTOUCH) 100 UNIT/ML FlexTouch Pen 15 mL 0    Sig: INJECT 40 UNITS INTO THE SKIN DAILY AS DIRECTED      Endocrinology:  Diabetes - Insulins Failed - 08/31/2020  4:36 PM      Failed - HBA1C is between 0 and 7.9 and within 180 days    Hgb A1c MFr Bld  Date Value Ref Range Status  08/10/2020 12.2 (H) 4.8 - 5.6 % Final    Comment:             Prediabetes: 5.7 - 6.4          Diabetes: >6.4          Glycemic control for adults with diabetes: <7.0           Passed - Valid encounter within last 6 months    Recent Outpatient Visits           3 weeks ago Uncontrolled type 2 diabetes mellitus with hyperglycemia (HCC)   [facility] Flinchum, Eula Fried, FNP   5 years ago Allergic conjunctivitis, right   [facility], Thao P, DO   5 years ago Diabetes type 2, uncontrolled   [facility], Kenyon Ana, MD   6 years ago Type II or unspecified type diabetes mellitus without mention of complication, not stated as uncontrolled   [facility], Kenyon Ana, MD   7 years ago Cough   [facility], Levell July, MD       Future Appointments             In 2 weeks Flinchum, Eula Fried, FNP [facility]

## 2020-08-31 NOTE — Telephone Encounter (Signed)
Patient called to ask about the refill sent on 08/24/20. He says he never picked it up because the pharmacy is wanting Marcelino Duster to send in an alternative due to his insurance doesn't cover. He says he is now back in [place] and is aware he has an upcoming appointment, but has been out of his insulin for a while now. He says he's been calling to get this straightened out and if he needs to go to the UC to receive his medicine until he's seen in the office, he will do that. He also says he has not heard from anyone about the endocrinology referral. I advised the referral was ordered on 08/24/20 and due to the holiday, it may be delayed. I advised to call the office of the endocrinologist to check on the referral and schedule the appointment, number provided. Advised that someone from the office will look into the referral to make sure it's done on this end. Advised this note will be sent to [facility] about the [place] and someone will call with her recommendation. Patient verbalized understanding.

## 2020-08-31 NOTE — Telephone Encounter (Signed)
Patient says he is no longer out of state in [place], he is back her in [place]. He did not receive this message. He says the pharmacy did tell him that he will need another medication ordered to replace [place].

## 2020-08-31 NOTE — Telephone Encounter (Signed)
PT need a refill  insulin aspart (NOVOLOG FLEXPEN) 100 UNIT/ML FlexPen [[id number]]  [id number] - [place], [place] - [address] AT [facility] [address]  [address] [place]  [address]  Phone: [phone] Fax: [phone]

## 2020-09-02 ENCOUNTER — Other Ambulatory Visit: Payer: Self-pay | Admitting: Adult Health

## 2020-09-02 DIAGNOSIS — E1165 Type 2 diabetes mellitus with hyperglycemia: Secondary | ICD-10-CM

## 2020-09-02 MED ORDER — TRESIBA FLEXTOUCH 100 UNIT/ML ~~LOC~~ SOPN: PEN_INJECTOR | SUBCUTANEOUS | 0 refills | Status: DC

## 2020-09-02 NOTE — Telephone Encounter (Signed)
Did he get his Novolog ?    See below message I  can not send alterative to tresiba as he is allergic to lantus and Toujeo both containing insulin glargine. I sent [name] again since he has taken it prior pharmacy may need to send a pre authorization to [place]. For [place].  Allergies  Allergen Reactions   Lantus [Insulin Glargine] Swelling     Copied from CRM [id number]. Topic: General - Other >> Aug 22, 2020  5:02 PM Dalphine Handing A wrote: Pharmacy stated that received requests for 2 medications that are not covered by patients insurance and recommened alternatives to medication be sent over that are covered.  Instead of Novolog, humalog is covered. Instead of traceba, lantus or tujeo is covered. Please advise   [id number] [place], [place] - [address]    Phone:             [phone] Fax:     [phone]

## 2020-09-02 NOTE — Telephone Encounter (Signed)
He needs office follow up to discuss he is allergic to insulin glargline. May need to increase some oral medications. He should also get a call from endocrinologist within the next week for appointment.

## 2020-09-02 NOTE — Telephone Encounter (Signed)
Advised patient of message below he states that he is on Novolog sliding scale and states that Lee Maldonado is taken at night and is long lasting.

## 2020-09-02 NOTE — Progress Notes (Signed)
Orders Placed This Encounter   Procedures   . Ambulatory referral to Endocrinology

## 2020-09-02 NOTE — Telephone Encounter (Signed)
I thought we sent in a different prescription to pharmacy because med he was requesting wasn't on file. See fyi below. KW

## 2020-09-02 NOTE — Telephone Encounter (Signed)
Novolog never picked up insurance would not cover. Please review  chart . KW

## 2020-09-02 NOTE — Telephone Encounter (Signed)
Patient never received Rx he states insurance wouldn't cover, he said that an alternative would have to be sent in. Lee Maldonado

## 2020-09-02 NOTE — Telephone Encounter (Signed)
You are correct, patient's  messages have been confusing and I am unclear what he is wanting. I know you had a hard time reaching him in [place]. Can you clarify with him exactly what he is taking currently  and has been taking ?  Let him know he has been referred to endocrinology for management of diabetes.

## 2020-09-05 NOTE — Telephone Encounter (Signed)
I advised patient as below, he declined office visit and request call back from you. KW

## 2020-09-05 NOTE — Telephone Encounter (Signed)
We received follow up prior authorization for [place]  and prefer we discuss in office since he has only been seen in office once. He will need t ogo to endocrinology for management given allergies. I do not recommend he wait until then though for appointment unless he has been scheduled already.

## 2020-09-08 NOTE — Telephone Encounter (Signed)
Noted thank you

## 2020-09-08 NOTE — Telephone Encounter (Signed)
Patient has appt with you 12/16. KW

## 2020-09-15 ENCOUNTER — Encounter: Payer: Self-pay | Admitting: Family Medicine

## 2020-09-15 ENCOUNTER — Other Ambulatory Visit: Payer: Self-pay

## 2020-09-15 ENCOUNTER — Ambulatory Visit: Payer: 59 | Admitting: Family Medicine

## 2020-09-15 VITALS — BP 151/93 | HR 90 | Temp 98.3°F | Wt 234.0 lb

## 2020-09-15 DIAGNOSIS — E1165 Type 2 diabetes mellitus with hyperglycemia: Secondary | ICD-10-CM | POA: Diagnosis not present

## 2020-09-15 DIAGNOSIS — E785 Hyperlipidemia, unspecified: Secondary | ICD-10-CM

## 2020-09-15 DIAGNOSIS — I152 Hypertension secondary to endocrine disorders: Secondary | ICD-10-CM

## 2020-09-15 DIAGNOSIS — E1169 Type 2 diabetes mellitus with other specified complication: Secondary | ICD-10-CM | POA: Diagnosis not present

## 2020-09-15 DIAGNOSIS — E1159 Type 2 diabetes mellitus with other circulatory complications: Secondary | ICD-10-CM

## 2020-09-15 MED ORDER — TRESIBA FLEXTOUCH 100 UNIT/ML ~~LOC~~ SOPN: 40.0000 [IU] | PEN_INJECTOR | Freq: Every day | SUBCUTANEOUS | 11 refills | Status: DC

## 2020-09-15 MED ORDER — NOVOLOG FLEXPEN 100 UNIT/ML ~~LOC~~ SOPN
5.0000 [IU] | PEN_INJECTOR | Freq: Three times a day (TID) | SUBCUTANEOUS | 11 refills | Status: DC
Start: 2020-09-15 — End: 2020-09-19

## 2020-09-15 NOTE — Assessment & Plan Note (Addendum)
Chronic Unstable, last A1c 12.2 Currently on Metformin, has been unable to get Novolog in the past [place] and Novolog.  F/u in 2 months

## 2020-09-15 NOTE — Assessment & Plan Note (Signed)
BMI 35+ associated with T2DM, HTN and Hyperlipidemia Discussed the importance of healthy weight management discussed diet and exercise

## 2020-09-15 NOTE — Assessment & Plan Note (Signed)
Previously well controlled, BP elevated to 151/93 today potentially 2/2 uncontrolled blood sugar No medications changes today Readdress at 2 month f/u

## 2020-09-15 NOTE — Progress Notes (Signed)
Established patient visit   Patient: Lee Maldonado   DOB: [birth year]   39 y.o. Male  MRN: [id number] Visit Date: 09/15/2020  Today's healthcare provider: Lavon Paganini, MD   Chief Complaint  Patient presents with  . Follow-up   Subjective    HPI   Mr. Lockridge has been feeling well overall. He is concerned because his home blood sugars have been high as he has been unable to obtain his insulin recently. His last A1c was 12.2 1 month ago. He states that the barrier to administering his insulin was a complication with insurance and the pharmacy and he is motivated to take it. He has not had a recent eye or foot exam.   Social History   Tobacco Use  . Smoking status: Former Smoker    Types: Cigarettes    Quit date: 03/02/2012    Years since quitting: 8.5  . Smokeless tobacco: Never Used  Substance Use Topics  . Alcohol use: Yes  . Drug use: No       Medications: Outpatient Medications Prior to Visit  Medication Sig  . blood glucose meter kit and supplies Dispense based on patient and insurance preference. Use up to four times daily as directed. (FOR ICD-10 E10.9, E11.9). Check blood sugar daily fasting in am and before each meal, QID.  Marland Kitchen Lancets (ONETOUCH DELICA PLUS [id number]) MISC Apply topically 4 (four) times daily.  Marland Kitchen lisinopril (ZESTRIL) 20 MG tablet Take 1 tablet (20 mg total) by mouth daily.  . metFORMIN (GLUCOPHAGE-XR) 500 MG 24 hr tablet Take two tablets by mouth in the morning by mouth daily.  Glory Rosebush VERIO test strip 4 (four) times daily.  . rosuvastatin (CRESTOR) 10 MG tablet Take 1 tablet (10 mg total) by mouth daily.   No facility-administered medications prior to visit.    Review of Systems  Constitutional: Negative.   HENT: Negative.   Eyes: Negative.   Respiratory: Negative.   Cardiovascular: Negative.   Gastrointestinal: Negative.   Endocrine: Negative.   Genitourinary: Negative.   Musculoskeletal: Negative.   Skin: Negative.    Allergic/Immunologic: Negative.   Neurological: Negative.   Hematological: Negative.   Psychiatric/Behavioral: Negative.       Objective    BP (!) 151/93 (BP Location: Left Arm, Patient Position: Sitting, Cuff Size: Large)   Pulse 90   Temp 98.3 F (36.8 C) (Oral)   Wt 234 lb (106.1 kg)   BMI 35.58 kg/m    Physical Exam   General: Well appearing, in NAD Lungs: CTA bilaterally Cards: RRR no murmurs rubs or gallops GI: Soft Non-Tender Extremities:  Unremarkable foot exam    No results found for any visits on 09/15/20.  Assessment & Plan     Problem List Items Addressed This Visit      Cardiovascular and Mediastinum   Hypertension associated with diabetes ([place])    Previously well controlled, BP elevated to 151/93 today potentially 2/2 uncontrolled blood sugar No medications changes today Readdress at 2 month f/u      Relevant Medications   insulin aspart (NOVOLOG FLEXPEN) 100 UNIT/ML FlexPen   insulin degludec (TRESIBA FLEXTOUCH) 100 UNIT/ML FlexTouch Pen     Endocrine   Type II diabetes mellitus, uncontrolled ([facility]) - Primary    Chronic Unstable, last A1c 12.2 Currently on Metformin, has been unable to get Novolog in the past [place] and Novolog.  F/u in 2 months      Relevant Medications   insulin aspart (NOVOLOG FLEXPEN)  100 UNIT/ML FlexPen   insulin degludec (TRESIBA FLEXTOUCH) 100 UNIT/ML FlexTouch Pen   Other Relevant Orders   Ambulatory referral to Ophthalmology     Other   Hyperlipidemia    Continue current statin Recheck FLP in one year      Morbid obesity ([place])    BMI 35+ associated with T2DM, HTN and Hyperlipidemia Discussed the importance of healthy weight management discussed diet and exercise      Relevant Medications   insulin aspart (NOVOLOG FLEXPEN) 100 UNIT/ML FlexPen   insulin degludec (TRESIBA FLEXTOUCH) 100 UNIT/ML FlexTouch Pen     Sample of [place] given today.Can call back for insulin titration   Return in about 2  months (around 11/16/2020) for chronic disease f/u.      Patient seen along with [employer] student [facility]. I personally evaluated this patient along with the student, and verified all aspects of the history, physical exam, and medical decision making as documented by the student. I agree with the student's documentation and have made all necessary edits.  Chris Narasimhan, Dionne Bucy, MD, MPH [facility]

## 2020-09-15 NOTE — Assessment & Plan Note (Signed)
Continue current statin Recheck FLP in one year

## 2020-09-17 ENCOUNTER — Other Ambulatory Visit: Payer: Self-pay | Admitting: Adult Health

## 2020-09-17 NOTE — Telephone Encounter (Signed)
Requested Prescriptions  Pending Prescriptions Disp Refills  . TRESIBA FLEXTOUCH 100 UNIT/ML FlexTouch Pen [Pharmacy Med Name: TRESIBA FLEXTOUCH PEN ([id number]] 15 mL     Sig: ADMINISTER 40 UNITS UNDER THE SKIN DAILY AS DIRECTED     Endocrinology:  Diabetes - Insulins Failed - 09/17/2020 12:17 PM      Failed - HBA1C is between 0 and 7.9 and within 180 days    Hgb A1c MFr Bld  Date Value Ref Range Status  08/10/2020 12.2 (H) 4.8 - 5.6 % Final    Comment:             Prediabetes: 5.7 - 6.4          Diabetes: >6.4          Glycemic control for adults with diabetes: <7.0          Passed - Valid encounter within last 6 months    Recent Outpatient Visits          2 days ago Uncontrolled type 2 diabetes mellitus with hyperglycemia [facility])   [facility], Marzella Schlein, MD   1 month ago Uncontrolled type 2 diabetes mellitus with hyperglycemia [facility])   [facility] Flinchum, Eula Fried, FNP   5 years ago Allergic conjunctivitis, right   [facility], Thao P, DO   5 years ago Diabetes type 2, uncontrolled   [facility], Kenyon Ana, MD   6 years ago Type II or unspecified type diabetes mellitus without mention of complication, not stated as uncontrolled   [facility] Bug[facility], MD      Future Appointments            In 2 months Flinchum, Eula Fried, FNP [facility]

## 2020-09-19 ENCOUNTER — Other Ambulatory Visit: Payer: Self-pay | Admitting: Family Medicine

## 2020-09-19 MED ORDER — INSULIN LISPRO (1 UNIT DIAL) 100 UNIT/ML (KWIKPEN)
5.0000 [IU] | PEN_INJECTOR | Freq: Three times a day (TID) | SUBCUTANEOUS | 11 refills | Status: DC
Start: 2020-09-19 — End: 2020-12-30

## 2020-09-19 NOTE — Progress Notes (Signed)
PA for novolog received. Seems Humalog is now preferred. New Rx sent to pharmacy. Same sliding scale and dosing.  PA for Lee Maldonado also received. We will work on completing this.

## 2020-11-05 ENCOUNTER — Other Ambulatory Visit: Payer: Self-pay | Admitting: Adult Health

## 2020-11-05 DIAGNOSIS — E1165 Type 2 diabetes mellitus with hyperglycemia: Secondary | ICD-10-CM

## 2020-11-06 ENCOUNTER — Other Ambulatory Visit: Payer: Self-pay | Admitting: Adult Health

## 2020-11-06 DIAGNOSIS — E1165 Type 2 diabetes mellitus with hyperglycemia: Secondary | ICD-10-CM

## 2020-11-16 ENCOUNTER — Ambulatory Visit: Payer: 59 | Admitting: Internal Medicine

## 2020-11-18 ENCOUNTER — Ambulatory Visit (INDEPENDENT_AMBULATORY_CARE_PROVIDER_SITE_OTHER): Payer: 59 | Admitting: Adult Health

## 2020-11-18 DIAGNOSIS — Z5329 Procedure and treatment not carried out because of patient's decision for other reasons: Secondary | ICD-10-CM

## 2020-11-18 NOTE — Progress Notes (Signed)
      Established patient visit   No show for appointment.  

## 2020-12-30 ENCOUNTER — Ambulatory Visit: Payer: 59 | Admitting: Internal Medicine

## 2020-12-30 ENCOUNTER — Other Ambulatory Visit: Payer: Self-pay

## 2020-12-30 ENCOUNTER — Encounter: Payer: Self-pay | Admitting: Internal Medicine

## 2020-12-30 VITALS — BP 152/92 | HR 92 | Ht 68.0 in | Wt 240.2 lb

## 2020-12-30 DIAGNOSIS — E1165 Type 2 diabetes mellitus with hyperglycemia: Secondary | ICD-10-CM

## 2020-12-30 DIAGNOSIS — E785 Hyperlipidemia, unspecified: Secondary | ICD-10-CM

## 2020-12-30 LAB — POCT GLUCOSE (DEVICE FOR HOME USE): POC Glucose: 269 mg/dl — AB (ref 70–99)

## 2020-12-30 LAB — POCT GLYCOSYLATED HEMOGLOBIN (HGB A1C): Hemoglobin A1C: 10.5 % — AB (ref 4.0–5.6)

## 2020-12-30 MED ORDER — TRESIBA FLEXTOUCH 200 UNIT/ML ~~LOC~~ SOPN: 30.0000 [IU] | PEN_INJECTOR | Freq: Every day | SUBCUTANEOUS | 6 refills | Status: DC

## 2020-12-30 MED ORDER — ROSUVASTATIN CALCIUM 10 MG PO TABS: 10.0000 mg | ORAL_TABLET | Freq: Every day | ORAL | 3 refills | Status: DC

## 2020-12-30 MED ORDER — INSULIN PEN NEEDLE 29G X 5MM MISC: 1.0000 | Freq: Four times a day (QID) | 3 refills | Status: DC

## 2020-12-30 MED ORDER — METFORMIN HCL ER 500 MG PO TB24: 1000.0000 mg | ORAL_TABLET | Freq: Two times a day (BID) | ORAL | 3 refills | Status: DC

## 2020-12-30 MED ORDER — HUMALOG KWIKPEN 200 UNIT/ML ~~LOC~~ SOPN: PEN_INJECTOR | SUBCUTANEOUS | 3 refills | Status: DC

## 2020-12-30 NOTE — Patient Instructions (Addendum)
-   Start Metformin 1 tablet daily with Breakfast for 1 week, then increase to 1 tablet with Breakfast and 1 tablet with Supper for  1 week, then increase to 2 tablets with Breakfast  and 1 tablet with Supper for another 1 week , then finally 2 Tablets with Breakfast and 2 tablets with Supper.   - Tresiba 30 units daily  - Humalog 15 units with each meal  - Humalog correctional insulin: ADD extra units on insulin to your meal-time Humalog dose if your blood sugars are higher than 150. Use the scale below to help guide you:   Blood sugar before meal Number of units to inject  Less than 150 0 unit  151 -  170 1 units  171 -  190 2 units  191 -  210 3 units  2112 -  230 4 units  231 -  250 5 units  251 -  270 6 units  271 -  290 7 units  291 -  310 8 units  311- 330 9 units         Choose healthy, lower carb lower calorie snacks: toss salad, cooked vegetables, cottage cheese, peanut butter, low fat cheese / string cheese, lower sodium deli meat, tuna salad or chicken salad    HOW TO TREAT LOW BLOOD SUGARS (Blood sugar LESS THAN 70 MG/DL)  Please follow the RULE OF 15 for the treatment of hypoglycemia treatment (when your (blood sugars are less than 70 mg/dL)    STEP 1: Take 15 grams of carbohydrates when your blood sugar is low, which includes:   3-4 GLUCOSE TABS  OR  3-4 OZ OF JUICE OR REGULAR SODA OR  ONE TUBE OF GLUCOSE GEL     STEP 2: RECHECK blood sugar in 15 MINUTES STEP 3: If your blood sugar is still low at the 15 minute recheck --> then, go back to STEP 1 and treat AGAIN with another 15 grams of carbohydrates.

## 2020-12-30 NOTE — Progress Notes (Signed)
Name: Lee Maldonado  MRN/ DOB: [id number], [birth year]   Age/ Sex: 40 y.o., male    PCP: Doreen Beam, FNP   Reason for Endocrinology Evaluation: Type 2 Diabetes Mellitus     Date of Initial Endocrinology Visit: 12/30/2020     PATIENT IDENTIFIER: Mr. Lee Maldonado is a 40 y.o. male with a past medical history of T2DM and pancreatitis. The patient presented for initial endocrinology clinic visit on 12/30/2020 for consultative assistance with his diabetes management.    HPI: Mr. Bradt was    Diagnosed with DM in 2011 Prior Medications tried/Intolerance: Metformin - diarrhea but able to tolerate extended release . Jardiance - no intolerance  Currently checking blood sugars 0 x / day.  Hypoglycemia episodes : no           Hemoglobin A1c has ranged from 6.8% in 2014, peaking at 12.2% in 2021. Patient required assistance for hypoglycemia: no  Patient has required hospitalization within the last 1 year from hyper or hypoglycemia: no   In terms of diet, the patient eats 3 meals a day , drinks sugar sweetened beverages. Snacks 3x a day   Has been only taking Humalog 30 units  Has been taking it twice a day      HOME DIABETES REGIMEN: HUmalog 30 units TID QAC- takes it twice a day  Tresiba 40 units daily - not taking  Metformin 500 mg XR BID- not taking    Statin: yes ACE-I/ARB: yes Prior Diabetic Education: yes    METER DOWNLOAD SUMMARY: Did not bring    DIABETIC COMPLICATIONS: Microvascular complications:    Denies: CKD, retinopathy , neuropathy   Last eye exam: Completed long time ago   Macrovascular complications:    Denies: CAD, PVD, CVA   PAST HISTORY: Past Medical History:  Past Medical History:  Diagnosis Date  . Diabetes mellitus   . Hypercholesteremia   . Hypertension   . Pancreatitis     Past Surgical History: History reviewed. No pertinent surgical history.   Social History:  reports that he quit smoking about 8 years ago. His smoking use  included cigarettes. He has never used smokeless tobacco. He reports current alcohol use. He reports that he does not use drugs. Family History:  Family History  Problem Relation Age of Onset  . Diabetes Mother   . Diabetes Father      HOME MEDICATIONS: Allergies as of 12/30/2020      Reactions   Lantus [insulin Glargine] Swelling      Medication List       Accurate as of December 30, 2020  9:38 AM. If you have any questions, ask your nurse or doctor.        blood glucose meter kit and supplies Dispense based on patient and insurance preference. Use up to four times daily as directed. (FOR ICD-10 E10.9, E11.9). Check blood sugar daily fasting in am and before each meal, QID.   insulin lispro 100 UNIT/ML KwikPen Commonly known as: HumaLOG KwikPen Inject 5-15 Units into the skin 3 (three) times daily.   lisinopril 20 MG tablet Commonly known as: ZESTRIL TAKE 1 TABLET(20 MG) BY MOUTH DAILY   metFORMIN 500 MG 24 hr tablet Commonly known as: GLUCOPHAGE-XR TAKE 2 TABLETS BY MOUTH DAILY IN THE MORNING   OneTouch Delica Plus [id number] Misc Apply topically 4 (four) times daily.   OneTouch Verio test strip Generic drug: glucose blood 4 (four) times daily.   rosuvastatin 10 MG tablet Commonly known as: CRESTOR TAKE  1 TABLET(10 MG) BY MOUTH DAILY   Tyler Aas FlexTouch 100 UNIT/ML FlexTouch Pen Generic drug: insulin degludec ADMINISTER 40 UNITS UNDER THE SKIN DAILY AS DIRECTED        ALLERGIES: Allergies  Allergen Reactions  . Lantus [Insulin Glargine] Swelling     REVIEW OF SYSTEMS: A comprehensive ROS was conducted with the patient and is negative except as per HPI and below:  Review of Systems  Gastrointestinal: Negative for diarrhea and nausea.      OBJECTIVE:   VITAL SIGNS: BP (!) 152/92   Pulse 92   Ht $R'5\' 8"'HQ$  (1.727 m)   Wt 240 lb 4 oz (109 kg)   SpO2 98%   BMI 36.53 kg/m    PHYSICAL EXAM:  General: Pt appears well and is in NAD  Neck: General:  Supple without adenopathy or carotid bruits. Thyroid: Thyroid size normal.  No goiter or nodules appreciated. No thyroid bruit.  Lungs: Clear with good BS bilat with no rales, rhonchi, or wheezes  Heart: RRR   Abdomen: Normoactive bowel sounds, soft, nontender, without masses or organomegaly palpable  Extremities:  Lower extremities - No pretibial edema. No lesions.  Skin: Normal texture and temperature to palpation. No rash noted. No Acanthosis nigricans/skin tags. No lipohypertrophy.  Neuro: MS is good with appropriate affect, pt is alert and Ox3    DM foot exam: 12/30/2020  The skin of the feet is intact without sores or ulcerations. The pedal pulses are 2+ on right and 2+ on left. The sensation is intact to a screening 5.07, 10 gram monofilament bilaterally   DATA REVIEWED:  Lab Results  Component Value Date   HGBA1C 10.5 (A) 12/30/2020   HGBA1C 12.2 (H) 08/10/2020   HGBA1C 10.3 11/29/2015   Lab Results  Component Value Date   MICROALBUR 5.5 (H) 12/02/2014   [place] 95 08/10/2020   CREATININE 0.87 08/10/2020     Lab Results  Component Value Date   CHOL 154 08/10/2020   HDL 41 08/10/2020   LDLCALC 95 08/10/2020   TRIG 97 08/10/2020   CHOLHDL 3.8 08/10/2020        ASSESSMENT / PLAN / RECOMMENDATIONS:   1) Type 2 Diabetes Mellitus, Poorly controlled, Without complications - Most recent A1c of 10.5 %. Goal A1c < 7.0 %.    Plan: GENERAL: I have discussed with the patient the pathophysiology of diabetes. We went over the natural progression of the disease. We talked about both insulin resistance and insulin deficiency. We stressed the importance of lifestyle changes including diet and exercise. I explained the complications associated with diabetes including retinopathy, nephropathy, neuropathy as well as increased risk of cardiovascular disease. We went over the benefit seen with glycemic control.    I explained to the patient that diabetic patients are at higher than  normal risk for amputations.  Discussed pharmacokinetics of basal/bolus insulin and the importance of taking prandial insulin with meals.   We also discussed avoiding sugar-sweetened beverages and snacks, when possible.   Pt with Hx of PANCREATITIS, Thus GLP_1 and DPP-4 inhibitors are contraindicated   Poorly controlled diabetes due to dietary indiscretions and sum-optimal medical management   He is interested in restating Jardiance, will revisit next visit   MEDICATIONS:  Metformin 500 mg 2 tablets BID   Tresiba 30 units daily   Humalog 15 units with each meal   CF : HUmalog ( BG-130/20)   EDUCATION / INSTRUCTIONS:  BG monitoring instructions: Patient is instructed to check his blood sugars 3  times a day, before meals .  Call [place] [facility] if: BG persistently < 70  . I reviewed the Rule of 15 for the treatment of hypoglycemia in detail with the patient. Literature supplied.   2) Diabetic complications:   Eye: Does not have known diabetic retinopathy.   Neuro/ Feet: Does not have known diabetic peripheral neuropathy.  Renal: Patient does not have known baseline CKD. He is  on an ACEI/ARB at present.  3) Dyslipidemia:  - LDL has been at goal but he has been without his statin therapy. Discussed the cardiovascular benefits of statins.  - Will refill   Restart  Rosuvastatin 10 mg daily    F/U in 3 months   Signed electronically by: Mack Guise, MD  [facility] Endocrinology  [facility] [address] [place], [place] [address] Phone: [phone] FAX: [phone]   CC: Doreen Beam, [place] [address] Phone: [phone]  Fax: [phone]    Return to Endocrinology clinic as below: No future appointments.

## 2021-02-08 ENCOUNTER — Telehealth: Payer: Self-pay | Admitting: Internal Medicine

## 2021-02-08 NOTE — Telephone Encounter (Signed)
PA have been submitted to patient's insurance 

## 2021-02-08 NOTE — Telephone Encounter (Signed)
Patient called re: Patient states PHARM told him that his RX for metFORMIN (GLUCOPHAGE-XR) 500 MG 24 hr tablet was denied. PA required per [facility]. Patient states he is completely out of Metformin and requests to be called at ph# [phone] to be advised on what he should do (such as change PHARM?).

## 2021-03-16 ENCOUNTER — Other Ambulatory Visit: Payer: Self-pay

## 2021-03-16 DIAGNOSIS — E1165 Type 2 diabetes mellitus with hyperglycemia: Secondary | ICD-10-CM

## 2021-03-16 MED ORDER — LISINOPRIL 20 MG PO TABS: ORAL_TABLET | ORAL | 0 refills | Status: DC

## 2021-03-16 NOTE — Telephone Encounter (Signed)
[facility] faxed refill request for the following medications: ? ?lisinopril (ZESTRIL) 20 MG tablet  ? ?Please advise. ? ?

## 2021-03-20 ENCOUNTER — Telehealth: Payer: Self-pay | Admitting: Family Medicine

## 2021-03-20 NOTE — Telephone Encounter (Signed)
Opened in error. TNP

## 2021-03-29 ENCOUNTER — Ambulatory Visit: Payer: Self-pay | Admitting: Internal Medicine

## 2021-10-09 ENCOUNTER — Other Ambulatory Visit: Payer: Self-pay

## 2021-10-09 ENCOUNTER — Telehealth: Payer: Self-pay

## 2021-10-09 NOTE — Telephone Encounter (Signed)
[facility] faxed refill request for the following medications:  insulin degludec (TRESIBA FLEXTOUCH) 200 UNIT/ML FlexTouch Pen   Please advise.

## 2021-10-09 NOTE — Telephone Encounter (Signed)
Converted to refill request 

## 2021-10-23 ENCOUNTER — Telehealth: Payer: Self-pay | Admitting: Adult Health

## 2021-10-23 NOTE — Telephone Encounter (Signed)
[facility] faxed refill request for the following medications:  insulin degludec (TRESIBA FLEXTOUCH) 100 UNIT/ML FlexTouch Pen   Please advise.

## 2021-11-27 ENCOUNTER — Telehealth: Payer: Self-pay | Admitting: Adult Health

## 2021-11-28 ENCOUNTER — Other Ambulatory Visit: Payer: Self-pay | Admitting: Internal Medicine

## 2021-11-28 DIAGNOSIS — E1165 Type 2 diabetes mellitus with hyperglycemia: Secondary | ICD-10-CM

## 2021-11-28 MED ORDER — HUMALOG KWIKPEN 200 UNIT/ML ~~LOC~~ SOPN: PEN_INJECTOR | SUBCUTANEOUS | 0 refills | Status: DC

## 2021-11-28 MED ORDER — TRESIBA FLEXTOUCH 200 UNIT/ML ~~LOC~~ SOPN: 30.0000 [IU] | PEN_INJECTOR | Freq: Every day | SUBCUTANEOUS | 0 refills | Status: DC

## 2021-11-28 MED ORDER — METFORMIN HCL ER 500 MG PO TB24: 1000.0000 mg | ORAL_TABLET | Freq: Two times a day (BID) | ORAL | 0 refills | Status: DC

## 2021-11-28 MED ORDER — INSULIN PEN NEEDLE 29G X 5MM MISC: 1.0000 | Freq: Four times a day (QID) | 0 refills | Status: DC

## 2023-01-04 ENCOUNTER — Other Ambulatory Visit: Payer: Self-pay

## 2023-01-15 ENCOUNTER — Other Ambulatory Visit: Payer: Self-pay

## 2023-04-05 LAB — HM HEPATITIS C SCREENING LAB: HM Hepatitis Screen: NEGATIVE

## 2023-04-05 LAB — HM HIV SCREENING LAB: HM HIV Screening: NEGATIVE

## 2023-06-13 ENCOUNTER — Ambulatory Visit (HOSPITAL_COMMUNITY)
Admission: EM | Admit: 2023-06-13 | Discharge: 2023-06-13 | Disposition: A | Discharge status: Home / Self Care | Payer: BC Managed Care – PPO | Attending: Family Medicine | Admitting: Family Medicine

## 2023-06-13 ENCOUNTER — Encounter (HOSPITAL_COMMUNITY): Payer: Self-pay

## 2023-06-13 DIAGNOSIS — Z760 Encounter for issue of repeat prescription: Secondary | ICD-10-CM | POA: Diagnosis present

## 2023-06-13 DIAGNOSIS — R300 Dysuria: Secondary | ICD-10-CM | POA: Insufficient documentation

## 2023-06-13 DIAGNOSIS — R369 Urethral discharge, unspecified: Secondary | ICD-10-CM | POA: Diagnosis present

## 2023-06-13 LAB — POCT URINALYSIS DIP (MANUAL ENTRY)
Bilirubin, UA: NEGATIVE
Glucose, UA: >=1000 mg/dL — AB
Ketones, POC UA: NEGATIVE mg/dL
Leukocytes, UA: NEGATIVE
Nitrite, UA: NEGATIVE
Protein Ur, POC: 100 mg/dL — AB
Spec Grav, UA: 1.02 (ref 1.010–1.025)
Urobilinogen, UA: 0.2 U/dL
pH, UA: 6 (ref 5.0–8.0)

## 2023-06-13 MED ORDER — LIDOCAINE HCL (PF) 1 % IJ SOLN
INTRAMUSCULAR | Status: AC
  Filled 2023-06-13: qty 2

## 2023-06-13 MED ORDER — CEFTRIAXONE SODIUM 500 MG IJ SOLR
INTRAMUSCULAR | Status: AC
  Filled 2023-06-13: qty 500

## 2023-06-13 MED ORDER — INSULIN PEN NEEDLE 29G X 5MM MISC: 1.0000 | Freq: Four times a day (QID) | 0 refills | Status: AC

## 2023-06-13 MED ORDER — HUMALOG KWIKPEN 200 UNIT/ML ~~LOC~~ SOPN: PEN_INJECTOR | SUBCUTANEOUS | 0 refills | Status: AC

## 2023-06-13 MED ORDER — CEFTRIAXONE SODIUM 500 MG IJ SOLR
500.0000 mg | Freq: Once | INTRAMUSCULAR | Status: AC
  Administered 2023-06-13: 500 mg via INTRAMUSCULAR

## 2023-06-13 MED ORDER — DOXYCYCLINE HYCLATE 100 MG PO CAPS: 100.0000 mg | ORAL_CAPSULE | Freq: Two times a day (BID) | ORAL | 0 refills | Status: DC

## 2023-06-13 MED ORDER — TRESIBA FLEXTOUCH 200 UNIT/ML ~~LOC~~ SOPN: 30.0000 [IU] | PEN_INJECTOR | Freq: Every day | SUBCUTANEOUS | 0 refills | Status: AC

## 2023-06-13 NOTE — Discharge Instructions (Signed)

## 2023-06-13 NOTE — ED Notes (Signed)
Reviewed work note 

## 2023-06-13 NOTE — ED Triage Notes (Signed)
Patient having penile discharge with white coloration, urinary frequency onset 1 day ago. No pain, rash, or swelling.  Patient needing refill of his diabetic medications.

## 2023-06-14 LAB — CYTOLOGY, (ORAL, ANAL, URETHRAL) ANCILLARY ONLY
Chlamydia: POSITIVE — AB
Comment: NEGATIVE
Comment: NEGATIVE
Comment: NORMAL
Neisseria Gonorrhea: NEGATIVE
Trichomonas: NEGATIVE

## 2023-06-14 LAB — URINE CULTURE: Culture: NO GROWTH

## 2023-06-18 NOTE — ED Provider Notes (Signed)
[facility]ENTER   [id number] 06/13/23 Arrival Time: 1938  ASSESSMENT & PLAN:  1. Penile discharge   2. Dysuria   3. Medication refill       Discharge Instructions      You have been given the following today for treatment of suspected gonorrhea and/or chlamydia:  cefTRIAXone (ROCEPHIN) injection 500 mg  Please pick up your prescription for doxycycline 100 mg and begin taking twice daily for the next seven (7) days.  Even though we have treated you today, we have sent testing for sexually transmitted infections. We will notify you of any positive results once they are received. If required, we will prescribe any medications you might need.  Please refrain from all sexual activity for at least the next seven days.     Pending: Labs Reviewed  POCT URINALYSIS DIP (MANUAL ENTRY) - Abnormal; Notable for the following components:      Result Value   Clarity, UA cloudy (*)    Glucose, UA >=1,000 (*)    Blood, UA trace-intact (*)    Protein Ur, POC =100 (*)    All other components within normal limits          URINE CULTURE   Urethral cytology pending.  Will notify of any positive results. Instructed to refrain from sexual activity for at least seven days.  Meds ordered this encounter  Medications   insulin degludec (TRESIBA FLEXTOUCH) 200 UNIT/ML FlexTouch Pen    Sig: Inject 30 Units into the skin daily.    Dispense:  15 mL    Refill:  0    No refills without endocrinology   Insulin Pen Needle 29G X MISC    Sig: 1 Device by Does not apply route in the morning, at noon, in the evening, and at bedtime.    Dispense:  400 each    Refill:  0    NO refills without OV visit with endo   insulin lispro (HUMALOG KWIKPEN) 200 UNIT/ML KwikPen    Sig: Max daily 75 units    Dispense:  30 mL    Refill:  0    No refills without OV visit with Endocrinology   Rec PCP f/u for DM.  Reviewed expectations re: course of current medical issues. Questions  answered. Outlined signs and symptoms indicating need for more acute intervention. Patient verbalized understanding. After Visit Summary given.   SUBJECTIVE:  Lee Maldonado is a 42 y.o. male who presents with complaint of penile discharge. Patient having penile discharge with white coloration, urinary frequency onset 1 day ago. No pain, rash, or swelling.  Patient needing refill of his diabetic medications.    OBJECTIVE:  Vitals:   06/13/23 1955 06/13/23 1956  BP:  (!) 167/111  Pulse:  93  Resp:  18  Temp:  98.4 F (36.9 C)  TempSrc:  Oral  SpO2:  95%  Weight: 106.6 kg   Height: 5\' 8"  (1.727 m)     General appearance: alert, cooperative, appears stated age and no distress Throat: lips, mucosa, and tongue normal; teeth and gums normal Lungs: unlabored respirations; speaks full sentences without difficulty Back: no CVA tenderness; FROM at waist Abdomen: soft, non-tender GU: deferred Skin: warm and dry Psychological: alert and cooperative; normal mood and affect.  Results for orders placed or performed during the hospital encounter of 06/13/23  Urine Culture   Specimen: Urine, Clean Catch  Result Value Ref Range   Specimen Description URINE, CLEAN CATCH    Special Requests NONE  Culture      NO GROWTH Performed at [facility], [address], [place], [place] [address]    Report Status 06/14/2023 FINAL   POC urinalysis dipstick  Result Value Ref Range   Color, UA yellow yellow   Clarity, UA cloudy (A) clear   Glucose, UA >=1,000 (A) negative mg/dL   Bilirubin, UA negative negative   Ketones, POC UA negative negative mg/dL   Spec Grav, UA 6.045 4.098 - 1.025   Blood, UA trace-intact (A) negative   pH, UA 6.0 5.0 - 8.0   Protein Ur, POC =100 (A) negative mg/dL   Urobilinogen, UA 0.2 0.2 or 1.0 E.U./dL   Nitrite, UA Negative Negative   Leukocytes, UA Negative Negative  Cytology (oral, anal, urethral) ancillary only  Result Value Ref Range   Neisseria  Gonorrhea Negative    Chlamydia Positive (A)    Trichomonas Negative    Comment Normal Reference Range Trichomonas - Negative    Comment Normal Reference Ranger Chlamydia - Negative    Comment      Normal Reference Range Neisseria Gonorrhea - Negative    Labs Reviewed  POCT URINALYSIS DIP (MANUAL ENTRY) - Abnormal; Notable for the following components:      Result Value   Clarity, UA cloudy (*)    Glucose, UA >=1,000 (*)    Blood, UA trace-intact (*)    Protein Ur, POC =100 (*)    All other components within normal limits  CYTOLOGY, (ORAL, ANAL, URETHRAL) ANCILLARY ONLY - Abnormal; Notable for the following components:   Chlamydia Positive (*)    All other components within normal limits  URINE CULTURE    Allergies  Allergen Reactions   Lantus [Insulin Glargine] Swelling    Past Medical History:  Diagnosis Date   Diabetes mellitus    Hypercholesteremia    Hypertension    Pancreatitis    Family History  Problem Relation Age of Onset   Diabetes Mother    Diabetes Father    Social History   Socioeconomic History   Marital status: Married    Spouse name: Not on file   Number of children: Not on file   Years of education: Not on file   Highest education level: Not on file  Occupational History   Not on file  Tobacco Use   Smoking status: Every Day    Current packs/day: 0.00    Types: Cigarettes    Last attempt to quit: 03/02/2012    Years since quitting: 11.3   Smokeless tobacco: Never  Vaping Use   Vaping status: Never Used  Substance and Sexual Activity   Alcohol use: Yes   Drug use: No   Sexual activity: Yes  Other Topics Concern   Not on file  Social History Narrative   Not on file   Social Determinants of Health   Financial Resource Strain: Low Risk  (04/05/2023)   Received from [facility]   Overall Financial Resource Strain (CARDIA)    Difficulty of Paying Living Expenses: Not hard at all  Food Insecurity: No Food Insecurity  (04/05/2023)   Received from [facility]   Hunger Vital Sign    Worried About Running Out of Food in the Last Year: Never true    Ran Out of Food in the Last Year: Never true  Transportation Needs: No Transportation Needs (04/05/2023)   Received from [facility]   PRAPARE - Transportation    In the  past 12 months, has lack of transportation kept you from medical appointments or from getting medications?: No    Lack of Transportation (Non-Medical): No  Physical Activity: Not on file  Stress: Not on file  Social Connections: Not on file  Intimate Partner Violence: Not on file           Hawthorne, Barany, MD 06/18/23 1150

## 2023-07-18 ENCOUNTER — Ambulatory Visit: Payer: 59 | Admitting: Family Medicine

## 2023-10-03 ENCOUNTER — Emergency Department (HOSPITAL_BASED_OUTPATIENT_CLINIC_OR_DEPARTMENT_OTHER): Payer: BC Managed Care – PPO | Admitting: Radiology

## 2023-10-03 ENCOUNTER — Other Ambulatory Visit: Payer: Self-pay

## 2023-10-03 ENCOUNTER — Encounter (HOSPITAL_BASED_OUTPATIENT_CLINIC_OR_DEPARTMENT_OTHER): Payer: Self-pay | Admitting: Emergency Medicine

## 2023-10-03 ENCOUNTER — Emergency Department (HOSPITAL_BASED_OUTPATIENT_CLINIC_OR_DEPARTMENT_OTHER)
Admission: EM | Admit: 2023-10-03 | Discharge: 2023-10-03 | Disposition: A | Discharge status: Home / Self Care | Payer: BC Managed Care – PPO | Attending: Emergency Medicine | Admitting: Emergency Medicine

## 2023-10-03 DIAGNOSIS — Z23 Encounter for immunization: Secondary | ICD-10-CM | POA: Insufficient documentation

## 2023-10-03 DIAGNOSIS — S6992XA Unspecified injury of left wrist, hand and finger(s), initial encounter: Secondary | ICD-10-CM | POA: Diagnosis present

## 2023-10-03 DIAGNOSIS — E119 Type 2 diabetes mellitus without complications: Secondary | ICD-10-CM | POA: Insufficient documentation

## 2023-10-03 DIAGNOSIS — S61012A Laceration without foreign body of left thumb without damage to nail, initial encounter: Secondary | ICD-10-CM | POA: Insufficient documentation

## 2023-10-03 DIAGNOSIS — Z794 Long term (current) use of insulin: Secondary | ICD-10-CM | POA: Diagnosis not present

## 2023-10-03 DIAGNOSIS — W260XXA Contact with knife, initial encounter: Secondary | ICD-10-CM | POA: Diagnosis not present

## 2023-10-03 MED ORDER — LIDOCAINE HCL (PF) 1 % IJ SOLN
5.0000 mL | Freq: Once | INTRAMUSCULAR | Status: AC
  Administered 2023-10-03: 5 mL
  Filled 2023-10-03: qty 5

## 2023-10-03 MED ORDER — CEPHALEXIN 500 MG PO CAPS: 500.0000 mg | ORAL_CAPSULE | Freq: Two times a day (BID) | ORAL | 0 refills | Status: AC

## 2023-10-03 MED ORDER — TETANUS-DIPHTH-ACELL PERTUSSIS 5-2.5-18.5 LF-MCG/0.5 IM SUSY
0.5000 mL | PREFILLED_SYRINGE | Freq: Once | INTRAMUSCULAR | Status: AC
  Administered 2023-10-03: 0.5 mL via INTRAMUSCULAR
  Filled 2023-10-03: qty 0.5

## 2023-10-03 NOTE — ED Provider Notes (Signed)
  EMERGENCY DEPARTMENT AT [facility] Provider Note   CSN: [id number] Arrival date & time: 10/03/23  0126     History  Chief Complaint  Patient presents with  . Laceration    Lee Maldonado is a 43 y.o. male.  43 year old male history of diabetes here today for, to the left thumb.  Patient right hand dominant.  Patient says that he was cutting something with a knife and slipped.   Laceration      Home Medications Prior to Admission medications   Medication Sig Start Date End Date Taking? Authorizing Provider  cephALEXin  (KEFLEX ) 500 MG capsule Take 1 capsule (500 mg total) by mouth 2 (two) times daily for 2 days. 10/03/23 10/05/23 Yes Mannie Pac T, DO  blood glucose meter kit and supplies Dispense based on patient and insurance preference. Use up to four times daily as directed. (FOR ICD-10 E10.9, E11.9). Check blood sugar daily fasting in am and before each meal, QID. 08/10/20   Flinchum, Rosaline RAMAN, FNP  doxycycline  (VIBRAMYCIN ) 100 MG capsule Take 1 capsule (100 mg total) by mouth 2 (two) times daily. 06/13/23   Rolinda Redell, MD  insulin  degludec (TRESIBA  FLEXTOUCH) 200 UNIT/ML FlexTouch Pen Inject 30 Units into the skin daily. 06/13/23   Rolinda Redell, MD  insulin  lispro (HUMALOG  KWIKPEN) 200 UNIT/ML KwikPen Max daily 75 units 06/13/23   Rolinda Redell, MD  Insulin  Pen Needle 29G X MISC 1 Device by Does not apply route in the morning, at noon, in the evening, and at bedtime. 06/13/23   Rolinda Redell, MD  Lancets [facility] DELICA PLUS [place]) MISC Apply topically 4 (four) times daily. 08/10/20   [provider]  [facility] VERIO test strip 4 (four) times daily. 08/10/20   [provider]  Telmisartan -amLODIPine  40-5 MG TABS Take 1 tablet by mouth daily.    [provider]      Allergies    Lantus  [insulin  glargine]    Review of Systems   Review of Systems  Physical Exam Updated Vital Signs BP (!) 159/94   Pulse 95   Temp  98 F (36.7 C) (Oral)   Resp 18   SpO2 98%  Physical Exam Vitals reviewed.  Musculoskeletal:     Comments: Patient able to flex, extend and oppose thumb without any limitations.  Skin:    Comments: 3.75 cm laceration to the left thumb between the PIP and MCP.  Space between fourth and fifth digit.  Neurological:     Mental Status: He is alert.    ED Results / Procedures / Treatments   Labs (all labs ordered are listed, but only abnormal results are displayed) Labs Reviewed - No data to display  EKG None  Radiology DG Finger Thumb Left Result Date: 10/03/2023 CLINICAL DATA:  Injury, cut with knife. EXAM: LEFT THUMB 2+V COMPARISON:  None Available. FINDINGS: There is no evidence of fracture or dislocation. There is no evidence of arthropathy or other focal bone abnormality. Skin irregularity about the interphalangeal joint of the thumb. No radiopaque foreign body. IMPRESSION: Skin irregularity about the interphalangeal joint of the thumb. No fracture or radiopaque foreign body. Electronically Signed   By: Andrea Gasman M.D.   On: 10/03/2023 02:51    Procedures .Laceration Repair  Date/Time: 10/03/2023 7:32 AM  Performed by: Mannie Pac DASEN, DO Authorized by: Mannie Pac DASEN, DO   Laceration details:    Length (cm):  3.8 Comments:     There irrigated with 10 cc of sterile  saline.  Anesthetized using 5 cc of 1% lidocaine  without epinephrine.  Using 4-0 Prolene, placed 1 horizontal mattress suture, 3 simple interrupted sutures.  Hemostasis achieved.  Bandage applied.  Patient tolerated procedure well.     Medications Ordered in ED Medications  lidocaine  (PF) (XYLOCAINE ) 1 % injection 5 mL (has no administration in time range)  Tdap (BOOSTRIX ) injection 0.5 mL (has no administration in time range)    ED Course/ Medical Decision Making/ A&P                                 Medical Decision Making 43 year old male here today with laceration to the  thumb.  Plan-reviewed the patient's plain films that were done which did not show fracture.  Patient without any evidence of tendon injury.  Hemostasis achieved with suturing.  Given the duration that this wound was left open, will provide the patient with prophylactic antibiotics as he is a diabetic.  Tetanus provided.  Discharge.  Amount and/or Complexity of Data Reviewed Radiology: ordered.  Risk Prescription drug management.            Final Clinical Impression(s) / ED Diagnoses Final diagnoses:  Laceration of left thumb without foreign body without damage to nail, initial encounter    Rx / DC Orders ED Discharge Orders          Ordered    cephALEXin  (KEFLEX ) 500 MG capsule  2 times daily        10/03/23 0734              Mannie Pac T, DO 10/03/23 [phone]

## 2023-10-03 NOTE — ED Triage Notes (Signed)
 Pt reports getting cut w/ knife of left thumb. Bleeding controlled in triage.

## 2023-10-03 NOTE — ED Notes (Signed)

## 2023-10-03 NOTE — Discharge Instructions (Addendum)
 Your stitches need to be looked at in 5 to 7 days.  You can go to an urgent care, your primary care doctor, or return to the emergency department.  Leave the dressing on today, you can take the dressing down and gently wash it with soap and water this evening.  Apply antibiotic ointment and a Band-Aid to the area.  You can take Keflex  2 times per day for the next 2 days to help prevent infection.

## 2024-01-10 ENCOUNTER — Emergency Department (HOSPITAL_COMMUNITY)
Admission: EM | Admit: 2024-01-10 | Discharge: 2024-01-11 | Disposition: A | Discharge status: Home / Self Care | Attending: Emergency Medicine | Admitting: Emergency Medicine

## 2024-01-10 ENCOUNTER — Other Ambulatory Visit: Payer: Self-pay

## 2024-01-10 DIAGNOSIS — R1013 Epigastric pain: Secondary | ICD-10-CM | POA: Diagnosis not present

## 2024-01-10 DIAGNOSIS — I10 Essential (primary) hypertension: Secondary | ICD-10-CM | POA: Insufficient documentation

## 2024-01-10 DIAGNOSIS — Z794 Long term (current) use of insulin: Secondary | ICD-10-CM | POA: Insufficient documentation

## 2024-01-10 DIAGNOSIS — Z79899 Other long term (current) drug therapy: Secondary | ICD-10-CM | POA: Diagnosis not present

## 2024-01-10 DIAGNOSIS — E119 Type 2 diabetes mellitus without complications: Secondary | ICD-10-CM | POA: Diagnosis not present

## 2024-01-10 DIAGNOSIS — D72829 Elevated white blood cell count, unspecified: Secondary | ICD-10-CM | POA: Insufficient documentation

## 2024-01-10 DIAGNOSIS — R109 Unspecified abdominal pain: Secondary | ICD-10-CM | POA: Diagnosis present

## 2024-01-10 LAB — URINALYSIS, ROUTINE W REFLEX MICROSCOPIC
Bacteria, UA: NONE SEEN
Bilirubin Urine: NEGATIVE
Glucose, UA: >=500 mg/dL — AB
Hgb urine dipstick: NEGATIVE
Ketones, ur: NEGATIVE mg/dL
Leukocytes,Ua: NEGATIVE
Nitrite: NEGATIVE
Protein, ur: >=300 mg/dL — AB
Specific Gravity, Urine: 1.024 (ref 1.005–1.030)
pH: 5 (ref 5.0–8.0)

## 2024-01-10 LAB — CBC
HCT: 40.4 % (ref 39.0–52.0)
Hemoglobin: 13.8 g/dL (ref 13.0–17.0)
MCH: 29.2 pg (ref 26.0–34.0)
MCHC: 34.2 g/dL (ref 30.0–36.0)
MCV: 85.4 fL (ref 80.0–100.0)
Platelets: 306 10*3/uL (ref 150–400)
RBC: 4.73 MIL/uL (ref 4.22–5.81)
RDW: 12.2 % (ref 11.5–15.5)
WBC: 14.8 10*3/uL — ABNORMAL HIGH (ref 4.0–10.5)
nRBC: 0 % (ref 0.0–0.2)

## 2024-01-10 LAB — COMPREHENSIVE METABOLIC PANEL WITH GFR
ALT: 25 U/L (ref 0–44)
AST: 15 U/L (ref 15–41)
Albumin: 3.6 g/dL (ref 3.5–5.0)
Alkaline Phosphatase: 61 U/L (ref 38–126)
Anion gap: 10 (ref 5–15)
BUN: 17 mg/dL (ref 6–20)
CO2: 24 mmol/L (ref 22–32)
Calcium: 9 mg/dL (ref 8.9–10.3)
Chloride: 102 mmol/L (ref 98–111)
Creatinine, Ser: 1.23 mg/dL (ref 0.61–1.24)
GFR, Estimated: >60 mL/min (ref >60)
Glucose, Bld: 276 mg/dL — ABNORMAL HIGH (ref 70–99)
Potassium: 3.8 mmol/L (ref 3.5–5.1)
Sodium: 136 mmol/L (ref 135–145)
Total Bilirubin: 0.7 mg/dL (ref 0.0–1.2)
Total Protein: 7.7 g/dL (ref 6.5–8.1)

## 2024-01-10 LAB — LIPASE, BLOOD: Lipase: 30 U/L (ref 11–51)

## 2024-01-10 NOTE — ED Triage Notes (Addendum)
 Patient reports pain across abdomen and left flank onset this evening with nausea and diarrhea , patient states symptoms similar to his pancreatitis flare up in the past . No fever or chills.

## 2024-01-11 ENCOUNTER — Emergency Department (HOSPITAL_COMMUNITY)

## 2024-01-11 MED ORDER — KETOROLAC TROMETHAMINE 15 MG/ML IJ SOLN
15.0000 mg | Freq: Once | INTRAMUSCULAR | Status: AC
  Administered 2024-01-11: 15 mg via INTRAVENOUS
  Filled 2024-01-11: qty 1

## 2024-01-11 MED ORDER — IOHEXOL 350 MG/ML SOLN
75.0000 mL | Freq: Once | INTRAVENOUS | Status: AC | PRN
  Administered 2024-01-11: 75 mL via INTRAVENOUS

## 2024-01-11 MED ORDER — SODIUM CHLORIDE 0.9 % IV BOLUS
1000.0000 mL | Freq: Once | INTRAVENOUS | Status: AC
  Administered 2024-01-11: 1000 mL via INTRAVENOUS

## 2024-01-11 MED ORDER — ONDANSETRON HCL 4 MG PO TABS: 4.0000 mg | ORAL_TABLET | ORAL | 0 refills | Status: DC | PRN

## 2024-01-11 MED ORDER — ALUM & MAG HYDROXIDE-SIMETH 200-200-20 MG/5ML PO SUSP
15.0000 mL | Freq: Once | ORAL | Status: AC
  Administered 2024-01-11: 15 mL via ORAL
  Filled 2024-01-11: qty 30

## 2024-01-11 MED ORDER — SUCRALFATE 1 G PO TABS: 1.0000 g | ORAL_TABLET | Freq: Three times a day (TID) | ORAL | 0 refills | Status: DC

## 2024-01-11 MED ORDER — PANTOPRAZOLE SODIUM 40 MG PO TBEC: 40.0000 mg | DELAYED_RELEASE_TABLET | Freq: Every day | ORAL | 0 refills | Status: DC

## 2024-01-11 NOTE — Discharge Instructions (Addendum)
 It was a pleasure caring for you today in the emergency department.  You should return to the hospital if you experience return of persistent nausea and vomiting that does not resolve and does not allow you to tolerate any food or fluids, persistent fevers for greater than 2-3 more days, increasing abdominal pain that persists despite medications, persistent diarrhea, dizziness, syncope (fainting), or for any other concerns.    Please stop smoking  Please return to the emergency department immediately for any new or concerning symptoms, or if you get worse.

## 2024-01-11 NOTE — ED Provider Notes (Signed)
 [place] EMERGENCY DEPARTMENT AT [facility] Provider Note  CSN: [id number] Arrival date & time: 01/10/24 2257  Chief Complaint(s) Abdominal Pain  HPI Lee Maldonado is a 43 y.o. male with past medical history as below, significant for DM, HTN, HLD, pancreatitis, obesity who presents to the ED with complaint of abdominal pain, nausea and diarrhea  Onset of left upper quadrant right upper quadrant abdominal pain, cramping this afternoon, gradually worsening but while waiting the lobby the pain seems to have improved.  Nausea without vomiting.  Had a few episodes of diarrhea earlier today, no BRBPR or melena.  No fevers, no jaundice, no recent travel or sick contacts,p.o. intake.  No recent alcohol.  No bleeding.  Patient concerned that he might have pancreatitis  Past Medical History Past Medical History:  Diagnosis Date   Diabetes mellitus    Hypercholesteremia    Hypertension    Pancreatitis    Patient Active Problem List   Diagnosis Date Noted   Morbid obesity (HCC) 09/15/2020   Encounter for immunization 08/10/2020   Hypertension associated with diabetes (HCC) 08/10/2020   Body mass index (BMI) of 34.0-34.9 in adult 08/10/2020   Hyperlipidemia 09/28/2016   Essential hypertension, benign 12/31/2014   Type II diabetes mellitus, uncontrolled 12/31/2014   DM (diabetes mellitus) (HCC) 11/27/2011   Home Medication(s) Prior to Admission medications   Medication Sig Start Date End Date Taking? Authorizing Provider  ondansetron (ZOFRAN) 4 MG tablet Take 1 tablet (4 mg total) by mouth every 4 (four) hours as needed for nausea or vomiting. 01/11/24  Yes Russella Courts A, DO  pantoprazole (PROTONIX) 40 MG tablet Take 1 tablet (40 mg total) by mouth daily for 14 days. 01/11/24 01/25/24 Yes Russella Courts A, DO  sucralfate (CARAFATE) 1 g tablet Take 1 tablet (1 g total) by mouth with breakfast, with lunch, and with evening meal for 7 days. 01/11/24 01/18/24 Yes Russella Courts A, DO  blood  glucose meter kit and supplies Dispense based on patient and insurance preference. Use up to four times daily as directed. (FOR ICD-10 E10.9, E11.9). Check blood sugar daily fasting in am and before each meal, QID. 08/10/20   Flinchum, Charlton Cooler, FNP  doxycycline (VIBRAMYCIN) 100 MG capsule Take 1 capsule (100 mg total) by mouth 2 (two) times daily. 06/13/23   Afton Albright, MD  insulin degludec (TRESIBA FLEXTOUCH) 200 UNIT/ML FlexTouch Pen Inject 30 Units into the skin daily. 06/13/23   Afton Albright, MD  insulin lispro (HUMALOG KWIKPEN) 200 UNIT/ML KwikPen Max daily 75 units 06/13/23   Afton Albright, MD  Insulin Pen Needle 29G X MISC 1 Device by Does not apply route in the morning, at noon, in the evening, and at bedtime. 06/13/23   Afton Albright, MD  Lancets [facility] DELICA PLUS [place]) MISC Apply topically 4 (four) times daily. 08/10/20   [provider]  [facility] VERIO test strip 4 (four) times daily. 08/10/20   [provider]  Telmisartan-amLODIPine 40-5 MG TABS Take 1 tablet by mouth daily.    [provider]  Past Surgical History No past surgical history on file. Family History Family History  Problem Relation Age of Onset   Diabetes Mother    Diabetes Father     Social History Social History   Tobacco Use   Smoking status: Every Day    Current packs/day: 0.00    Types: Cigarettes    Last attempt to quit: 03/02/2012    Years since quitting: 11.8   Smokeless tobacco: Never  Vaping Use   Vaping status: Never Used  Substance Use Topics   Alcohol use: Yes   Drug use: No   Allergies Lantus [insulin glargine]  Review of Systems A thorough review of systems was obtained and all systems are negative except as noted in the HPI and PMH.   Physical Exam Vital Signs  I have reviewed the triage vital signs BP (!)  153/83   Pulse 87   Temp 98.2 F (36.8 C) (Oral)   Resp 16   SpO2 96%  Physical Exam Vitals and nursing note reviewed.  Constitutional:      General: He is not in acute distress.    Appearance: He is well-developed.  HENT:     Head: Normocephalic and atraumatic.     Right Ear: External ear normal.     Left Ear: External ear normal.     Mouth/Throat:     Mouth: Mucous membranes are moist.  Eyes:     General: No scleral icterus. Cardiovascular:     Rate and Rhythm: Normal rate and regular rhythm.     Pulses: Normal pulses.     Heart sounds: Normal heart sounds.  Pulmonary:     Effort: Pulmonary effort is normal. No respiratory distress.     Breath sounds: Normal breath sounds.  Abdominal:     General: Abdomen is flat.     Palpations: Abdomen is soft.     Tenderness: There is abdominal tenderness in the left upper quadrant. There is no guarding or rebound.  Musculoskeletal:     Cervical back: No rigidity.     Right lower leg: No edema.     Left lower leg: No edema.  Skin:    General: Skin is warm and dry.     Capillary Refill: Capillary refill takes less than 2 seconds.  Neurological:     Mental Status: He is alert.  Psychiatric:        Mood and Affect: Mood normal.        Behavior: Behavior normal.     ED Results and Treatments Labs (all labs ordered are listed, but only abnormal results are displayed) Labs Reviewed  COMPREHENSIVE METABOLIC PANEL WITH GFR - Abnormal; Notable for the following components:      Result Value   Glucose, Bld 276 (*)    All other components within normal limits  CBC - Abnormal; Notable for the following components:   WBC 14.8 (*)    All other components within normal limits  URINALYSIS, ROUTINE W REFLEX MICROSCOPIC - Abnormal; Notable for the following components:   Color, Urine AMBER (*)    APPearance CLOUDY (*)    Glucose, UA >=500 (*)    Protein, ur >=300 (*)    All other components within normal limits  LIPASE, BLOOD  Radiology CT ABDOMEN PELVIS W CONTRAST Result Date: 01/11/2024 CLINICAL DATA:  Left upper quadrant pain EXAM: CT ABDOMEN AND PELVIS WITH CONTRAST TECHNIQUE: Multidetector CT imaging of the abdomen and pelvis was performed using the standard protocol following bolus administration of intravenous contrast. RADIATION DOSE REDUCTION: This exam was performed according to the departmental dose-optimization program which includes automated exposure control, adjustment of the mA and/or kV according to patient size and/or use of iterative reconstruction technique. CONTRAST:  75mL OMNIPAQUE IOHEXOL 350 MG/ML SOLN COMPARISON:  03/07/2012 FINDINGS: Lower chest:  No contributory findings. Hepatobiliary: Marked hepatic steatosis.No evidence of biliary obstruction or stone. Pancreas: Unremarkable. Spleen: Unremarkable. Adrenals/Urinary Tract: Negative adrenals. No hydronephrosis or stone. Unremarkable bladder. Stomach/Bowel:  No obstruction. No appendicitis. Vascular/Lymphatic: No acute vascular abnormality. No mass or adenopathy. Reproductive:No acute finding. Vas deferens calcification correlating with history of diabetes. Other: No ascites or pneumoperitoneum. Musculoskeletal: No acute abnormalities. IMPRESSION: No acute finding or explanation for symptoms. Hepatic steatosis. Electronically Signed   By: Ronnette Coke M.D.   On: 01/11/2024 05:43    Pertinent labs & imaging results that were available during my care of the patient were reviewed by me and considered in my medical decision making (see MDM for details).  Medications Ordered in ED Medications  sodium chloride 0.9 % bolus 1,000 mL (1,000 mLs Intravenous New Bag/Given 01/11/24 0449)  alum & mag hydroxide-simeth (MAALOX/MYLANTA) 200-200-20 MG/5ML suspension 15 mL (15 mLs Oral Given 01/11/24 0449)  ketorolac (TORADOL) 15 MG/ML injection 15 mg (15  mg Intravenous Given 01/11/24 0449)  iohexol (OMNIPAQUE) 350 MG/ML injection 75 mL (75 mLs Intravenous Contrast Given 01/11/24 0509)                                                                                                                                     Procedures Procedures  (including critical care time)  Medical Decision Making / ED Course    Medical Decision Making:    Lee Maldonado is a 43 y.o. male with past medical history as below, significant for DM, HTN, HLD, pancreatitis, obesity who presents to the ED with complaint of abdominal pain, nausea and diarrhea. The complaint involves an extensive differential diagnosis and also carries with it a high risk of complications and morbidity.  Serious etiology was considered. Ddx includes but is not limited to: Differential diagnosis includes but is not exclusive to acute cholecystitis, intrathoracic causes for epigastric abdominal pain, gastritis, duodenitis, pancreatitis, small bowel or large bowel obstruction, abdominal aortic aneurysm, hernia, gastritis, etc.   Complete initial physical exam performed, notably the patient was in no acute distress, sitting comfortably.    Reviewed and confirmed nursing documentation for past medical history, family history, social history.  Vital signs reviewed.     Clinical Course as of 01/11/24 1610  Sat Jan 11, 2024  0604 CTAP stable [SG]  0626 Feeling better [SG]    Clinical Course User Index [SG] Martina Sledge,  Jola Nash, DO    Brief summary: 43 year old male with history as above here with upper abdominal pain, left upper quadrant.  Cramping, nausea, diarrhea.  No vomiting.  Symptoms have improved since the onset.  Labs ordered in triage reviewed, these are stable but he does have leukocytosis 14.8.  Will obtain CT imaging given ongoing symptoms.  Imaging is stable.  He is feeling much better, he is tolerant p.o. intake with difficulty.  He has leukocytosis, unclear etiology.  No fever.   Doubt sepsis.  Concern for possible gastritis. Viral illness or food borne illness. Recommend bland diet, PPI, Carafate, follow-up with  pcp encouraged     The patient improved significantly and was discharged in stable condition. Detailed discussions were had with the patient/guardian regarding current findings, and need for close f/u with PCP or on call doctor. The patient/guardian has been instructed to return immediately if the symptoms worsen in any way for re-evaluation. Patient/guardian verbalized understanding and is in agreement with current care plan. All questions answered prior to discharge.           Additional history obtained: -Additional history obtained from na -External records from outside source obtained and reviewed including: Chart review including previous notes, labs, imaging, consultation notes including  Prior ER visit, prior urgent care documentation, home medications   Lab Tests: -I ordered, reviewed, and interpreted labs.   The pertinent results include:   Labs Reviewed  COMPREHENSIVE METABOLIC PANEL WITH GFR - Abnormal; Notable for the following components:      Result Value   Glucose, Bld 276 (*)    All other components within normal limits  CBC - Abnormal; Notable for the following components:   WBC 14.8 (*)    All other components within normal limits  URINALYSIS, ROUTINE W REFLEX MICROSCOPIC - Abnormal; Notable for the following components:   Color, Urine AMBER (*)    APPearance CLOUDY (*)    Glucose, UA >=500 (*)    Protein, ur >=300 (*)    All other components within normal limits  LIPASE, BLOOD    Notable for leukocytosis  EKG   EKG Interpretation Date/Time:    Ventricular Rate:    PR Interval:    QRS Duration:    QT Interval:    QTC Calculation:   R Axis:      Text Interpretation:           Imaging Studies ordered: I ordered imaging studies including CT AP I independently visualized the following imaging with scope of  interpretation limited to determining acute life threatening conditions related to emergency care; findings noted above I independently visualized and interpreted imaging. I agree with the radiologist interpretation   Medicines ordered and prescription drug management: Meds ordered this encounter  Medications   sodium chloride 0.9 % bolus 1,000 mL   alum & mag hydroxide-simeth (MAALOX/MYLANTA) 200-200-20 MG/5ML suspension 15 mL   ketorolac (TORADOL) 15 MG/ML injection 15 mg   iohexol (OMNIPAQUE) 350 MG/ML injection 75 mL   sucralfate (CARAFATE) 1 g tablet    Sig: Take 1 tablet (1 g total) by mouth with breakfast, with lunch, and with evening meal for 7 days.    Dispense:  21 tablet    Refill:  0   pantoprazole (PROTONIX) 40 MG tablet    Sig: Take 1 tablet (40 mg total) by mouth daily for 14 days.    Dispense:  14 tablet    Refill:  0   ondansetron (ZOFRAN) 4 MG tablet  Sig: Take 1 tablet (4 mg total) by mouth every 4 (four) hours as needed for nausea or vomiting.    Dispense:  10 tablet    Refill:  0    -I have reviewed the patients home medicines and have made adjustments as needed   Consultations Obtained: na   Cardiac Monitoring: Continuous pulse oximetry interpreted by myself, 97% on ra.    Social Determinants of Health:  Diagnosis or treatment significantly limited by social determinants of health: current smoker, obese Counseled patient for approximately 3 minutes regarding smoking cessation. Discussed risks of smoking and how they applied and affected their visit here today. Patient not ready to quit at this time, however will follow up with their primary doctor when they are.   CPT code: 16109: intermediate counseling for smoking cessation     Reevaluation: After the interventions noted above, I reevaluated the patient and found that they have improved  Co morbidities that complicate the patient evaluation  Past Medical History:  Diagnosis Date   Diabetes  mellitus    Hypercholesteremia    Hypertension    Pancreatitis       Dispostion: Disposition decision including need for hospitalization was considered, and patient discharged from emergency department.    Final Clinical Impression(s) / ED Diagnoses Final diagnoses:  Epigastric pain        Teddi Favors, DO 01/11/24 [phone]

## 2024-01-22 ENCOUNTER — Ambulatory Visit (HOSPITAL_COMMUNITY)
Admission: EM | Admit: 2024-01-22 | Discharge: 2024-01-22 | Disposition: A | Discharge status: Home / Self Care | Attending: Family Medicine | Admitting: Family Medicine

## 2024-01-22 ENCOUNTER — Encounter (HOSPITAL_COMMUNITY): Payer: Self-pay

## 2024-01-22 DIAGNOSIS — H10023 Other mucopurulent conjunctivitis, bilateral: Secondary | ICD-10-CM | POA: Diagnosis not present

## 2024-01-22 DIAGNOSIS — H66003 Acute suppurative otitis media without spontaneous rupture of ear drum, bilateral: Secondary | ICD-10-CM | POA: Diagnosis not present

## 2024-01-22 DIAGNOSIS — J069 Acute upper respiratory infection, unspecified: Secondary | ICD-10-CM

## 2024-01-22 LAB — POC SARS CORONAVIRUS 2 AG -  ED: SARS Coronavirus 2 Ag: NEGATIVE

## 2024-01-22 MED ORDER — BENZONATATE 200 MG PO CAPS: 200.0000 mg | ORAL_CAPSULE | Freq: Three times a day (TID) | ORAL | 0 refills | Status: DC | PRN

## 2024-01-22 MED ORDER — IBUPROFEN 800 MG PO TABS
ORAL_TABLET | ORAL | Status: AC
  Filled 2024-01-22: qty 1

## 2024-01-22 MED ORDER — MOXIFLOXACIN HCL 0.5 % OP SOLN
1.0000 [drp] | Freq: Three times a day (TID) | OPHTHALMIC | 0 refills | Status: AC
Start: 2024-01-22 — End: 2024-01-29

## 2024-01-22 MED ORDER — IBUPROFEN 800 MG PO TABS
800.0000 mg | ORAL_TABLET | Freq: Once | ORAL | Status: AC
  Administered 2024-01-22: 800 mg via ORAL

## 2024-01-22 MED ORDER — AMOXICILLIN-POT CLAVULANATE 875-125 MG PO TABS: 1.0000 | ORAL_TABLET | Freq: Two times a day (BID) | ORAL | 0 refills | Status: DC

## 2024-01-22 NOTE — Discharge Instructions (Addendum)
 You were seen today for upper respiratory symptoms. Your covid swab was negative today.  I have sent out an antibiotic eye drop, oral antibiotic and cough medication.  Please use tylenol  and motrin  for any pain/fevers.  I recommend you get rest and increase fluids.  I have given you time off of work.  Please return if not improving as expected.

## 2024-01-22 NOTE — ED Triage Notes (Signed)
 Patient c/o a a productive cough with yellow sputum, bilateral ear pain, sore throat, and watery, itchy eyes x 2 days.  Patient states he has been taking alka Seltzer cold and flu.

## 2024-01-22 NOTE — ED Provider Notes (Signed)
 [facility]    CSN: [id number] Arrival date & time: 01/22/24  0800      History   Chief Complaint Chief Complaint  Patient presents with   Otalgia   Sore Throat   Cough    HPI Lee Maldonado is a 43 y.o. male.    Otalgia Associated symptoms: congestion, cough, fever and rhinorrhea   Sore Throat  Cough Associated symptoms: ear pain, eye discharge, fever and rhinorrhea    Patient is here for URI symptoms x 2 days . Having itchy watery eyes, eyes are red.  Also with bilateral ear pain.  Runny nose, congestion, cough.  He thinks he had fevers several night ago.   He feels chest congestion.  No known sick exposures.   His bp is elevated.  He did not take his medication this morning.  States he took it yesterday.       Past Medical History:  Diagnosis Date   Diabetes mellitus    Hypercholesteremia    Hypertension    Pancreatitis     Patient Active Problem List   Diagnosis Date Noted   Morbid obesity (HCC) 09/15/2020   Encounter for immunization 08/10/2020   Hypertension associated with diabetes (HCC) 08/10/2020   Body mass index (BMI) of 34.0-34.9 in adult 08/10/2020   Hyperlipidemia 09/28/2016   Essential hypertension, benign 12/31/2014   Type II diabetes mellitus, uncontrolled 12/31/2014   DM (diabetes mellitus) (HCC) 11/27/2011    History reviewed. No pertinent surgical history.     Home Medications    Prior to Admission medications   Medication Sig Start Date End Date Taking? Authorizing Provider  blood glucose meter kit and supplies Dispense based on patient and insurance preference. Use up to four times daily as directed. (FOR ICD-10 E10.9, E11.9). Check blood sugar daily fasting in am and before each meal, QID. 08/10/20   Flinchum, Charlton Cooler, FNP  doxycycline  (VIBRAMYCIN ) 100 MG capsule Take 1 capsule (100 mg total) by mouth 2 (two) times daily. 06/13/23   Afton Albright, MD  insulin  degludec (TRESIBA  FLEXTOUCH) 200 UNIT/ML FlexTouch  Pen Inject 30 Units into the skin daily. 06/13/23   Afton Albright, MD  insulin  lispro (HUMALOG  KWIKPEN) 200 UNIT/ML KwikPen Max daily 75 units 06/13/23   Afton Albright, MD  Insulin  Pen Needle 29G X MISC 1 Device by Does not apply route in the morning, at noon, in the evening, and at bedtime. 06/13/23   Afton Albright, MD  Lancets [facility] DELICA PLUS [place]) MISC Apply topically 4 (four) times daily. 08/10/20   [provider]  ondansetron  (ZOFRAN ) 4 MG tablet Take 1 tablet (4 mg total) by mouth every 4 (four) hours as needed for nausea or vomiting. 01/11/24   Teddi Favors, DO  ONETOUCH VERIO test strip 4 (four) times daily. 08/10/20   [provider]  pantoprazole  (PROTONIX ) 40 MG tablet Take 1 tablet (40 mg total) by mouth daily for 14 days. 01/11/24 01/25/24  Russella Courts A, DO  sucralfate  (CARAFATE ) 1 g tablet Take 1 tablet (1 g total) by mouth with breakfast, with lunch, and with evening meal for 7 days. 01/11/24 01/18/24  Russella Courts A, DO  Telmisartan-amLODIPine 40-5 MG TABS Take 1 tablet by mouth daily.    [provider]    Family History Family History  Problem Relation Age of Onset   Diabetes Mother    Diabetes Father     Social History Social History   Tobacco Use   Smoking status: Every  Day    Current packs/day: 0.00    Types: Cigarettes    Last attempt to quit: 03/02/2012    Years since quitting: 11.8   Smokeless tobacco: Never  Vaping Use   Vaping status: Never Used  Substance Use Topics   Alcohol use: Yes   Drug use: No     Allergies   Lantus  [insulin  glargine]   Review of Systems Review of Systems  Constitutional:  Positive for fever.  HENT:  Positive for congestion, ear pain and rhinorrhea.   Eyes:  Positive for discharge and redness.  Respiratory:  Positive for cough.   Gastrointestinal: Negative.   Musculoskeletal: Negative.   Psychiatric/Behavioral: Negative.       Physical Exam Triage Vital Signs ED Triage Vitals   Encounter Vitals Group     BP 01/22/24 0815 (!) 183/103     Systolic BP Percentile --      Diastolic BP Percentile --      Pulse Rate 01/22/24 0815 (!) 104     Resp 01/22/24 0815 16     Temp 01/22/24 0815 99.3 F (37.4 C)     Temp Source 01/22/24 0815 Oral     SpO2 01/22/24 0815 96 %     Weight --      Height --      Head Circumference --      Peak Flow --      Pain Score 01/22/24 0812 10     Pain Loc --      Pain Education --      Exclude from Growth Chart --    No data found.  Updated Vital Signs BP (!) 183/103 (BP Location: Right Arm)   Pulse (!) 104   Temp 99.3 F (37.4 C) (Oral)   Resp 16   SpO2 96%   Visual Acuity Right Eye Distance:   Left Eye Distance:   Bilateral Distance:    Right Eye Near:   Left Eye Near:    Bilateral Near:     Physical Exam Constitutional:      General: He is not in acute distress.    Appearance: He is well-developed and normal weight. He is not ill-appearing or toxic-appearing.  HENT:     Right Ear: Tympanic membrane is erythematous.     Left Ear: Tympanic membrane is erythematous.     Nose: Congestion and rhinorrhea present.     Mouth/Throat:     Mouth: Mucous membranes are moist.     Pharynx: Posterior oropharyngeal erythema present. No pharyngeal swelling or oropharyngeal exudate.  Eyes:     Extraocular Movements: Extraocular movements intact.     Conjunctiva/sclera:     Right eye: Right conjunctiva is injected. Exudate present.     Left eye: Left conjunctiva is injected. Exudate present.  Cardiovascular:     Rate and Rhythm: Normal rate and regular rhythm.  Pulmonary:     Effort: Pulmonary effort is normal.     Breath sounds: Normal breath sounds.  Musculoskeletal:     Cervical back: Normal range of motion and neck supple. Tenderness present.  Lymphadenopathy:     Cervical: No cervical adenopathy.  Skin:    General: Skin is warm.  Neurological:     General: No focal deficit present.     Mental Status: He is alert.   Psychiatric:        Mood and Affect: Mood normal.      UC Treatments / Results  Labs (all labs ordered are listed, but  only abnormal results are displayed) Labs Reviewed  POC SARS CORONAVIRUS 2 AG -  ED    EKG   Radiology No results found.  Procedures Procedures (including critical care time)  Medications Ordered in UC Medications  ibuprofen  (ADVIL ) tablet 800 mg (800 mg Oral Given 01/22/24 0830)    Initial Impression / Assessment and Plan / UC Course  I have reviewed the triage vital signs and the nursing notes.  Pertinent labs & imaging results that were available during my care of the patient were reviewed by me and considered in my medical decision making (see chart for details).    Final Clinical Impressions(s) / UC Diagnoses   Final diagnoses:  Other mucopurulent conjunctivitis of both eyes  Non-recurrent acute suppurative otitis media of both ears without spontaneous rupture of tympanic membranes  Upper respiratory tract infection, unspecified type     Discharge Instructions      You were seen today for upper respiratory symptoms. Your covid swab was negative today.  I have sent out an antibiotic eye drop, oral antibiotic and cough medication.  Please use tylenol  and motrin  for any pain/fevers.  I recommend you get rest and increase fluids.  I have given you time off of work.  Please return if not improving as expected.     ED Prescriptions     Medication Sig Dispense Auth. Provider   moxifloxacin  (VIGAMOX ) 0.5 % ophthalmic solution Place 1 drop into both eyes 3 (three) times daily for 7 days. 3 mL Araminta Zorn, MD   amoxicillin -clavulanate (AUGMENTIN ) 875-125 MG tablet Take 1 tablet by mouth every 12 (twelve) hours. 14 tablet Yashvi Jasinski, MD   benzonatate  (TESSALON ) 200 MG capsule Take 1 capsule (200 mg total) by mouth 3 (three) times daily as needed for cough. 21 capsule Lesle Ras, MD      PDMP not reviewed this encounter.   Lesle Ras, MD 01/22/24 [phone]

## 2024-03-09 NOTE — Progress Notes (Unsigned)
   There were no vitals taken for this visit.   Subjective:    Patient ID: Lee Maldonado, male    DOB: 1980-12-16, 43 y.o.   MRN: [id number]  HPI: Lee Maldonado is a 43 y.o. male  No chief complaint on file.   Discussed the use of AI scribe software for clinical note transcription with the patient, who gave verbal consent to proceed.  History of Present Illness          09/15/2020    5:04 PM 08/10/2020   10:59 AM 12/31/2014    2:56 PM  Depression screen PHQ 2/9  Decreased Interest 0 0 0  Down, Depressed, Hopeless 0 0 0  PHQ - 2 Score 0 0 0  Altered sleeping 2 0   Tired, decreased energy 1 0   Change in appetite 1 0   Feeling bad or failure about yourself  0 0   Trouble concentrating 0 0   Moving slowly or fidgety/restless 0 0   Suicidal thoughts 0 0   PHQ-9 Score 4 0   Difficult doing work/chores Not difficult at all Not difficult at all     Relevant past medical, surgical, family and social history reviewed and updated as indicated. Interim medical history since our last visit reviewed. Allergies and medications reviewed and updated.  Review of Systems  Per HPI unless specifically indicated above     Objective:      There were no vitals taken for this visit.  {Vitals History (Optional):23777} Wt Readings from Last 3 Encounters:  06/13/23 235 lb (106.6 kg)  12/30/20 240 lb 4 oz (109 kg)  09/15/20 234 lb (106.1 kg)    Physical Exam Physical Exam    Results for orders placed or performed during the hospital encounter of 01/22/24  POC SARS Coronavirus 2 Ag-ED - Nasal Swab   Collection Time: 01/22/24  8:51 AM  Result Value Ref Range   SARS Coronavirus 2 Ag Negative Negative   {Labs (Optional):23779}       Assessment & Plan:   Problem List Items Addressed This Visit   None    Assessment and Plan Assessment & Plan         Follow up plan: No follow-ups on file.

## 2024-03-10 ENCOUNTER — Ambulatory Visit: Admitting: Nurse Practitioner

## 2024-03-10 ENCOUNTER — Encounter: Payer: Self-pay | Admitting: Nurse Practitioner

## 2024-03-10 VITALS — BP 154/92 | HR 96 | Temp 98.0°F | Ht 68.5 in | Wt 235.3 lb

## 2024-03-10 DIAGNOSIS — R5383 Other fatigue: Secondary | ICD-10-CM

## 2024-03-10 DIAGNOSIS — E782 Mixed hyperlipidemia: Secondary | ICD-10-CM

## 2024-03-10 DIAGNOSIS — K219 Gastro-esophageal reflux disease without esophagitis: Secondary | ICD-10-CM

## 2024-03-10 DIAGNOSIS — R0683 Snoring: Secondary | ICD-10-CM

## 2024-03-10 DIAGNOSIS — Z794 Long term (current) use of insulin: Secondary | ICD-10-CM

## 2024-03-10 DIAGNOSIS — Z1159 Encounter for screening for other viral diseases: Secondary | ICD-10-CM

## 2024-03-10 DIAGNOSIS — Z23 Encounter for immunization: Secondary | ICD-10-CM | POA: Diagnosis not present

## 2024-03-10 DIAGNOSIS — I10 Essential (primary) hypertension: Secondary | ICD-10-CM | POA: Diagnosis not present

## 2024-03-10 DIAGNOSIS — E1165 Type 2 diabetes mellitus with hyperglycemia: Secondary | ICD-10-CM

## 2024-03-10 DIAGNOSIS — Z114 Encounter for screening for human immunodeficiency virus [HIV]: Secondary | ICD-10-CM

## 2024-03-10 MED ORDER — PANTOPRAZOLE SODIUM 40 MG PO TBEC
40.0000 mg | DELAYED_RELEASE_TABLET | Freq: Every day | ORAL | 1 refills | Status: AC
Start: 2024-03-10 — End: ?

## 2024-03-11 ENCOUNTER — Ambulatory Visit: Payer: Self-pay | Admitting: Nurse Practitioner

## 2024-03-11 DIAGNOSIS — R7989 Other specified abnormal findings of blood chemistry: Secondary | ICD-10-CM

## 2024-03-14 LAB — TESTOSTERONE, FREE & TOTAL
Free Testosterone: 44.9 pg/mL (ref 35.0–155.0)
Testosterone, Total, LC-MS-MS: 186 ng/dL — ABNORMAL LOW (ref 250–1100)

## 2024-03-14 LAB — CBC WITH DIFFERENTIAL/PLATELET
Absolute Lymphocytes: 2016 {cells}/uL (ref 850–3900)
Absolute Monocytes: 493 {cells}/uL (ref 200–950)
Basophils Absolute: 83 {cells}/uL (ref 0–200)
Basophils Relative: 1.3 %
Eosinophils Absolute: 192 {cells}/uL (ref 15–500)
Eosinophils Relative: 3 %
HCT: 44.5 % (ref 38.5–50.0)
Hemoglobin: 14.2 g/dL (ref 13.2–17.1)
MCH: 28.5 pg (ref 27.0–33.0)
MCHC: 31.9 g/dL — ABNORMAL LOW (ref 32.0–36.0)
MCV: 89.2 fL (ref 80.0–100.0)
MPV: 10.6 fL (ref 7.5–12.5)
Monocytes Relative: 7.7 %
Neutro Abs: 3616 {cells}/uL (ref 1500–7800)
Neutrophils Relative %: 56.5 %
Platelets: 287 10*3/uL (ref 140–400)
RBC: 4.99 10*6/uL (ref 4.20–5.80)
RDW: 13 % (ref 11.0–15.0)
Total Lymphocyte: 31.5 %
WBC: 6.4 10*3/uL (ref 3.8–10.8)

## 2024-03-14 LAB — MICROALBUMIN / CREATININE URINE RATIO
Creatinine, Urine: 78 mg/dL (ref 20–320)
Microalb Creat Ratio: 1058 mg/g{creat} — ABNORMAL HIGH (ref <30)
Microalb, Ur: 82.5 mg/dL

## 2024-03-14 LAB — VITAMIN D 25 HYDROXY (VIT D DEFICIENCY, FRACTURES): Vit D, 25-Hydroxy: 25 ng/mL — ABNORMAL LOW (ref 30–100)

## 2024-03-14 LAB — LIPID PANEL
Cholesterol: 202 mg/dL — ABNORMAL HIGH (ref <200)
HDL: 42 mg/dL (ref >=40)
LDL Cholesterol (Calc): 136 mg/dL — ABNORMAL HIGH
Non-HDL Cholesterol (Calc): 160 mg/dL — ABNORMAL HIGH (ref <130)
Total CHOL/HDL Ratio: 4.8 (calc) (ref <5.0)
Triglycerides: 121 mg/dL (ref <150)

## 2024-03-14 LAB — COMPREHENSIVE METABOLIC PANEL WITH GFR
AG Ratio: 1.3 (calc) (ref 1.0–2.5)
ALT: 25 U/L (ref 9–46)
AST: 14 U/L (ref 10–40)
Albumin: 4.2 g/dL (ref 3.6–5.1)
Alkaline phosphatase (APISO): 76 U/L (ref 36–130)
BUN: 19 mg/dL (ref 7–25)
CO2: 30 mmol/L (ref 20–32)
Calcium: 9.4 mg/dL (ref 8.6–10.3)
Chloride: 97 mmol/L — ABNORMAL LOW (ref 98–110)
Creat: 1.09 mg/dL (ref 0.60–1.29)
Globulin: 3.2 g/dL (ref 1.9–3.7)
Glucose, Bld: 372 mg/dL — ABNORMAL HIGH (ref 65–99)
Potassium: 5.4 mmol/L — ABNORMAL HIGH (ref 3.5–5.3)
Sodium: 134 mmol/L — ABNORMAL LOW (ref 135–146)
Total Bilirubin: 0.4 mg/dL (ref 0.2–1.2)
Total Protein: 7.4 g/dL (ref 6.1–8.1)
eGFR: 87 mL/min/{1.73_m2} (ref >=60)

## 2024-03-14 LAB — TSH: TSH: 0.59 m[IU]/L (ref 0.40–4.50)

## 2024-03-14 LAB — VITAMIN B12: Vitamin B-12: 827 pg/mL (ref 200–1100)

## 2024-03-14 LAB — HEMOGLOBIN A1C: Hgb A1c MFr Bld: >14 % — ABNORMAL HIGH (ref <5.7)

## 2024-03-19 ENCOUNTER — Ambulatory Visit: Admitting: Sleep Medicine

## 2024-04-02 ENCOUNTER — Encounter: Payer: Self-pay | Admitting: Sleep Medicine

## 2024-04-02 ENCOUNTER — Ambulatory Visit: Admitting: Sleep Medicine

## 2024-04-02 VITALS — BP 120/82 | HR 94 | Temp 98.0°F | Ht 68.5 in | Wt 236.6 lb

## 2024-04-02 DIAGNOSIS — E66812 Obesity, class 2: Secondary | ICD-10-CM | POA: Diagnosis not present

## 2024-04-02 DIAGNOSIS — F17210 Nicotine dependence, cigarettes, uncomplicated: Secondary | ICD-10-CM

## 2024-04-02 DIAGNOSIS — K219 Gastro-esophageal reflux disease without esophagitis: Secondary | ICD-10-CM

## 2024-04-02 DIAGNOSIS — G4733 Obstructive sleep apnea (adult) (pediatric): Secondary | ICD-10-CM | POA: Diagnosis not present

## 2024-04-02 DIAGNOSIS — Z6835 Body mass index (BMI) 35.0-35.9, adult: Secondary | ICD-10-CM | POA: Diagnosis not present

## 2024-04-02 NOTE — Progress Notes (Signed)
 Name:Lee Maldonado MRN: [id number] DOB: 1981-09-24   CHIEF COMPLAINT:  EXCESSIVE DAYTIME SLEEPINESS   HISTORY OF PRESENT ILLNESS:  Mr. Lee Maldonado is a 43 y.o. w/ a h/o HTN, GERD and obesity who present for c/o loud snoring, witnessed apnea and excessive daytime sleepiness which has been present for several years. Reports nocturnal awakenings due to unclear reasons, however does not have difficulty falling back to sleep. Denies any significant weight changes. Admits to dry mouth and occasional morning headaches. Denies RLS symptoms, dream enactment, cataplexy, hypnagogic or hypnapompic hallucinations. Reports a family history of sleep apnea. Reports drowsy driving. Drinks 2 sodas daily, smokes 3 cigarettes daily, occasional alcohol use, uses marijuana occasionally.   Bedtime 10 pm-12 am Sleep onset 10 mins Rise time 4 am   EPWORTH SLEEP SCORE 11    04/02/2024    2:00 PM  Results of the Epworth flowsheet  Sitting and reading 2  Watching TV 2  Sitting, inactive in a public place (e.g. a theatre or a meeting) 1  As a passenger in a car for an hour without a break 1  Lying down to rest in the afternoon when circumstances permit 2  Sitting and talking to someone 0  Sitting quietly after a lunch without alcohol 3  In a car, while stopped for a few minutes in traffic 0  Total score 11     PAST MEDICAL HISTORY :   has a past medical history of Allergy, Diabetes mellitus, GERD (gastroesophageal reflux disease), Hypercholesteremia, Hypertension, Pancreatitis, and Sleep apnea.  has no past surgical history on file. Prior to Admission medications   Medication Sig Start Date End Date Taking? Authorizing Provider  blood glucose meter kit and supplies Dispense based on patient and insurance preference. Use up to four times daily as directed. (FOR ICD-10 E10.9, E11.9). Check blood sugar daily fasting in am and before each meal, QID. 08/10/20   Flinchum, Rosaline RAMAN, FNP  insulin  degludec  (TRESIBA  FLEXTOUCH) 200 UNIT/ML FlexTouch Pen Inject 30 Units into the skin daily. 06/13/23   Rolinda Rogue, MD  insulin  lispro (HUMALOG  KWIKPEN) 200 UNIT/ML KwikPen Max daily 75 units 06/13/23   Rolinda Rogue, MD  Insulin  Pen Needle 29G X MISC 1 Device by Does not apply route in the morning, at noon, in the evening, and at bedtime. 06/13/23   Rolinda Rogue, MD  Lancets [facility] DELICA PLUS [place]) MISC Apply topically 4 (four) times daily. 08/10/20   [provider]  metFORMIN  (GLUCOPHAGE ) 1000 MG tablet Take 1,000 mg by mouth 2 (two) times daily with a meal.    [provider]  [facility] VERIO test strip 4 (four) times daily. 08/10/20   [provider]  pantoprazole  (PROTONIX ) 40 MG tablet Take 1 tablet (40 mg total) by mouth daily. 03/10/24   Pender, Julie F, FNP  Telmisartan-amLODIPine 40-5 MG TABS Take 1 tablet by mouth daily.    [provider]   Allergies  Allergen Reactions   Lantus  [Insulin  Glargine] Swelling    FAMILY HISTORY:  family history includes Diabetes in his father and mother. SOCIAL HISTORY:  reports that he has been smoking cigarettes. He has been exposed to tobacco smoke. He has never used smokeless tobacco. He reports current alcohol use of about 6.0 standard drinks of alcohol per week. He reports that he does not use drugs.   Review of Systems:  Gen:  Denies  fever, sweats, chills weight loss  HEENT: Denies blurred vision, double vision, ear  pain, eye pain, hearing loss, nose bleeds, sore throat Cardiac:  No dizziness, chest pain or heaviness, chest tightness,edema, No JVD Resp:   No cough, -sputum production, -shortness of breath,-wheezing, -hemoptysis,  Gi: Denies swallowing difficulty, stomach pain, nausea or vomiting, diarrhea, constipation, bowel incontinence Gu:  Denies bladder incontinence, burning urine Ext:   Denies Joint pain, stiffness or swelling Skin: Denies  skin rash, easy bruising or bleeding or hives Endoc:   Denies polyuria, polydipsia , polyphagia or weight change Psych:   Denies depression, insomnia or hallucinations  Other:  All other systems negative  VITAL SIGNS: BP 120/82 (BP Location: Right Arm, Cuff Size: Large)   Pulse 94   Temp 98 F (36.7 C)   Ht 5' 8.5 (1.74 m)   Wt 236 lb 9.6 oz (107.3 kg)   SpO2 97%   BMI 35.45 kg/m    Physical Examination:   General Appearance: No distress  EYES PERRLA, EOM intact.   NECK Supple, No JVD Pulmonary: normal breath sounds, No wheezing.  CardiovascularNormal S1,S2.  No m/r/g.   Abdomen: Benign, Soft, non-tender. Skin:   warm, no rashes, no ecchymosis  Extremities: normal, no cyanosis, clubbing. Neuro:without focal findings,  speech normal  PSYCHIATRIC: Mood, affect within normal limits.   ASSESSMENT AND PLAN  OSA I suspect that OSA is likely present due to clinical presentation. Discussed the consequences of untreated sleep apnea. Advised not to drive drowsy for safety of patient and others. Will complete further evaluation with a home sleep study and follow up to review results.    GERD Stable, on current management. Following with PCP.   Obesity Counseled patient on diet and lifestyle modification.    MEDICATION ADJUSTMENTS/LABS AND TESTS ORDERED: Recommend Sleep Study   Patient  satisfied with Plan of action and management. All questions answered  Follow up to review HST results and treatment plan.   I spent a total of 45 minutes reviewing chart data, face-to-face evaluation with the patient, counseling and coordination of care as detailed above.    Jonthan Leite, M.D.  Sleep Medicine [facility]

## 2024-04-02 NOTE — Patient Instructions (Signed)
 Lee Maldonado

## 2024-04-08 ENCOUNTER — Ambulatory Visit (INDEPENDENT_AMBULATORY_CARE_PROVIDER_SITE_OTHER): Admitting: Urology

## 2024-04-08 VITALS — BP 175/109 | HR 83 | Ht 68.5 in | Wt 236.0 lb

## 2024-04-08 DIAGNOSIS — Z125 Encounter for screening for malignant neoplasm of prostate: Secondary | ICD-10-CM | POA: Diagnosis not present

## 2024-04-08 DIAGNOSIS — N529 Male erectile dysfunction, unspecified: Secondary | ICD-10-CM | POA: Diagnosis not present

## 2024-04-08 DIAGNOSIS — E291 Testicular hypofunction: Secondary | ICD-10-CM

## 2024-04-08 MED ORDER — TADALAFIL 5 MG PO TABS: 5.0000 mg | ORAL_TABLET | Freq: Every day | ORAL | 11 refills | Status: AC | PRN

## 2024-04-08 NOTE — Patient Instructions (Signed)
 Hypogonadism, Male  Male hypogonadism is a condition of having a level of testosterone  that is lower than normal. Testosterone  is a chemical, or hormone, that is made mainly in the testicles. In boys, testosterone  is responsible for the development of male characteristics during puberty. These include: Making the penis bigger. Growing and building the muscles. Growing facial hair. Deepening the voice. In adult men, testosterone  is responsible for maintaining: An interest in sex and the ability to have sex. Muscle mass. Sperm production. Red blood cell production. Bone strength. Testosterone  also gives men energy and a sense of well-being. Testosterone  normally decreases as men age and the testicles make less testosterone . Testosterone  levels can vary from man to man. Not all men will have signs and symptoms of low testosterone . Weight, alcohol use, medicines, and certain medical conditions can affect a man's testosterone  level. What are the causes? This condition is caused by: A natural decrease in testosterone  that occurs as a man grows older. This is the main cause of this condition. Use of medicines, such as antidepressants, steroids, and opioids. Diseases and conditions that affect the testicles or the making of testosterone . These include: Injury or damage to the testicles from trauma, cancer, cancer treatment, or infection. Diabetes. Sleep apnea. Genetic conditions that men are born with. Disease of the pituitary gland. This gland is in the brain. It produces hormones. Obesity. Metabolic syndrome. This is a group of diseases that affect blood pressure, blood sugar, cholesterol, and belly fat. HIV or AIDS. Alcohol abuse. Kidney failure. Other long-term or chronic diseases. What are the signs or symptoms? Common symptoms of this condition include: Loss of interest in sex (low sex drive). Inability to have or maintain an erection (erectile dysfunction). Feeling tired  (fatigue). Mood changes, like irritability or depression. Loss of muscle and body hair. Infertility. Large breasts. Weight gain (obesity). How is this diagnosed? Your health care provider can diagnose hypogonadism based on: Your signs and symptoms. A physical exam to check your testosterone  levels. This includes blood tests. Testosterone  levels can change throughout the day. Levels are highest in the morning. You may need to have repeat blood tests before getting a diagnosis of hypogonadism. Depending on your medical history and test results, your health care provider may also do other tests to find the cause of low testosterone . How is this treated? This condition is treated with testosterone  replacement therapy. Testosterone  can be given by: Injection or through pellets inserted under the skin. Gels or patches placed on the skin or in the mouth. Testosterone  therapy is not for everyone. It has risks and side effects. Your health care provider will consider your medical history, your risk for prostate cancer, your age, and your symptoms before putting you on testosterone  replacement therapy. Follow these instructions at home: Take over-the-counter and prescription medicines only as told by your health care provider. Eat foods that are high in fiber, such as beans, whole grains, and fresh fruits and vegetables. Limit foods that are high in fat and processed sugars, such as fried or sweet foods. If you drink alcohol: Limit how much you have to 0-2 drinks a day. Know how much alcohol is in your drink. In the [place] one drink equals one 12 oz bottle of beer (355 mL), one 5 oz glass of wine (148 mL), or one 1 oz glass of hard liquor (44 mL). Return to your normal activities as told by your health care provider. Ask your health care provider what activities are safe for you. Keep all  follow-up visits. This is important. Contact a health care provider if: You have any of the signs or symptoms of  low testosterone . You have any side effects from testosterone  therapy. Summary Male hypogonadism is a condition of having a level of testosterone  that is lower than normal. The natural drop in testosterone  production that occurs with age is the most common cause of this condition. Low testosterone  can also be caused by many diseases and conditions that affect the testicles and the making of testosterone . This condition is treated with testosterone  replacement therapy. There are risks and side effects of testosterone  therapy. Your health care provider will consider your age, medical history, symptoms, and risks for prostate cancer before putting you on testosterone  therapy. This information is not intended to replace advice given to you by your health care provider. Make sure you discuss any questions you have with your health care provider. Document Revised: 08/26/2023 Document Reviewed: 08/26/2023 Elsevier Patient Education  2025 [employer].

## 2024-04-08 NOTE — Progress Notes (Signed)
   04/08/24 4:18 PM   Lee Maldonado 1981/01/02 [id number]  CC: Hypogonadism, ED, PSA screening  HPI: Comorbid 43 year old male with obesity BMI of 35, uncontrolled diabetes with recent hemoglobin A1c greater than 14 who was referred for low testosterone  value from June 2025 186.  He has symptoms of fatigue and ED.  Has never tried medications for ED.  He has 4 children, not interested in further biologic pregnancies.  Recent PSA normal at 0.27.   PMH: Past Medical History:  Diagnosis Date   Allergy    pollen   Diabetes mellitus    GERD (gastroesophageal reflux disease)    with certain foods   Hypercholesteremia    Hypertension    Pancreatitis    Sleep apnea    need c-pap machine     Family History: Family History  Problem Relation Age of Onset   Diabetes Mother    Diabetes Father     Social History:  reports that he has been smoking cigarettes. He has been exposed to tobacco smoke. He has never used smokeless tobacco. He reports current alcohol use of about 6.0 standard drinks of alcohol per week. He reports that he does not use drugs.  Physical Exam: BP (!) 175/109 (BP Location: Left Arm, Patient Position: Sitting, Cuff Size: Large)   Pulse 83   Ht 5' 8.5 (1.74 m)   Wt 236 lb (107 kg)   BMI 35.36 kg/m    Constitutional:  Alert and oriented, No acute distress. Cardiovascular: No clubbing, cyanosis, or edema. Respiratory: Normal respiratory effort, no increased work of breathing. GI: Abdomen is soft, nontender, nondistended, no abdominal masses   Assessment & Plan:   43 year old male with obesity and BMI of 35, uncontrolled diabetes with hemoglobin A1c greater than 14 referred for low testosterone  value of 186.  We reviewed the AUA guidelines regarding evaluation and management of patients with low testosterone  and ED.  We discussed the need for repeat low testosterone  value prior to considering treatment.  We discussed options like testosterone  replacement or  off-label Clomid or lifestyle changes.  He was primarily interested in Clomid.  Risks and benefits were reviewed extensively.  He was also interested in a trial of Cialis  for ED.  Repeat testosterone , LH, prolactin.  If T remains low will start Clomid 25 mg daily Trial of Cialis  5 to 20 mg on demand Discussed lifestyle changes including weight loss and improved diabetes control   Lee Burnet, MD 04/08/2024  [facility] [address], [address] [place], [place] [address] [phone]

## 2024-04-15 ENCOUNTER — Encounter

## 2024-04-15 DIAGNOSIS — G4733 Obstructive sleep apnea (adult) (pediatric): Secondary | ICD-10-CM

## 2024-05-11 LAB — HM DIABETES EYE EXAM

## 2024-05-11 NOTE — Progress Notes (Addendum)
 [facility] - Clinic Note  05/18/2024   CHIEF COMPLAINT Patient presents for Retina Evaluation  HISTORY OF PRESENT ILLNESS: Lee Maldonado is a 43 y.o. male who presents to the clinic today for:  HPI     Retina Evaluation   In both eyes.  I, the attending physician,  performed the HPI with the patient and updated documentation appropriately.        Comments   Pt referred by Dr. Gust for diabetic retinopathy/ERM OU. Pt states vision has been more blurry for the past 6 months, fluctuates with sugars. Pt denies FOL/floaters/pain. Pt does not use any ats. Pt has been diabetic since 2011. Pt is inconsistent with metformin  due to side effects. BS=200 something yesterday. A1c=14.0, 6.10.25      Last edited by Valdemar Redell, MD on 05/18/2024  9:49 AM.    A1c >14 on 06.10.25   Referring physician: Gareth Mliss FALCON, FNP [address] [place],  [place] [address]  HISTORICAL INFORMATION:  Selected notes from the MEDICAL RECORD NUMBER Referred by Dr. Gust LEE:  Ocular Hx- PMH-   CURRENT MEDICATIONS: No current outpatient medications on file. (Ophthalmic Drugs)   No current facility-administered medications for this visit. (Ophthalmic Drugs)   Current Outpatient Medications (Other)  Medication Sig   blood glucose meter kit and supplies Dispense based on patient and insurance preference. Use up to four times daily as directed. (FOR ICD-10 E10.9, E11.9). Check blood sugar daily fasting in am and before each meal, QID.   insulin  degludec (TRESIBA  FLEXTOUCH) 200 UNIT/ML FlexTouch Pen Inject 30 Units into the skin daily.   insulin  lispro (HUMALOG  KWIKPEN) 200 UNIT/ML KwikPen Max daily 75 units   Insulin  Pen Needle 29G X MISC 1 Device by Does not apply route in the morning, at noon, in the evening, and at bedtime.   Lancets (ONETOUCH DELICA PLUS LANCET33G) MISC Apply topically 4 (four) times daily.   metFORMIN  (GLUCOPHAGE ) 1000 MG tablet Take 1,000  mg by mouth 2 (two) times daily with a meal.   ONETOUCH VERIO test strip 4 (four) times daily.   tadalafil  (CIALIS ) 5 MG tablet Take 1-4 tablets (5-20 mg total) by mouth daily as needed for erectile dysfunction (take 1 hour prior to sexual activity).   Telmisartan-amLODIPine 40-5 MG TABS Take 1 tablet by mouth daily.   pantoprazole  (PROTONIX ) 40 MG tablet Take 1 tablet (40 mg total) by mouth daily. (Patient not taking: Reported on 05/18/2024)   No current facility-administered medications for this visit. (Other)   REVIEW OF SYSTEMS: ROS   Positive for: Endocrine, Cardiovascular, Eyes Negative for: Constitutional, Gastrointestinal, Neurological, Skin, Genitourinary, Musculoskeletal, HENT, Respiratory, Psychiatric, Allergic/Imm, Heme/Lymph Last edited by Elnor Avelina RAMAN, COT on 05/18/2024  8:25 AM.     ALLERGIES Allergies  Allergen Reactions   Lantus  [Insulin  Glargine] Swelling   PAST MEDICAL HISTORY Past Medical History:  Diagnosis Date   Allergy    pollen   Diabetes mellitus    GERD (gastroesophageal reflux disease)    with certain foods   Hypercholesteremia    Hypertension    Pancreatitis    Sleep apnea    need c-pap machine   History reviewed. No pertinent surgical history. FAMILY HISTORY Family History  Problem Relation Age of Onset   Diabetes Mother    Diabetes Father    SOCIAL HISTORY Social History   Tobacco Use   Smoking status: Every Day    Current packs/day: 0.25    Types: Cigarettes  Passive exposure: Yes   Smokeless tobacco: Never   Tobacco comments:    Smokes 2 cigarettes a day- khj 04/02/2024        Started smoking at 43 years old.    Smoked 1 PPD at his heaviest  Vaping Use   Vaping status: Never Used  Substance Use Topics   Alcohol use: Yes    Alcohol/week: 6.0 standard drinks of alcohol    Types: 2 Cans of beer, 2 Shots of liquor, 2 Standard drinks or equivalent per week   Drug use: No       OPHTHALMIC EXAM:  Base Eye Exam     Visual  Acuity (Snellen - Linear)       Right Left   Dist [place] 20/30 +2 20/25 -2   Dist ph  20/20 -2 20/20 -2         Tonometry (Tonopen, 9:49 AM)       Right Left   Pressure 18 20         Pachymetry     05/18/2024         Pupils       Pupils Dark Light Shape React APD   Right PERRL 3 2 Round Maldonado None   Left PERRL 3 2 Round Maldonado None         Visual Fields       Left Right    Full Full         Extraocular Movement       Right Left    Full, Ortho Full, Ortho         Neuro/Psych     Oriented x3: Yes         Dilation     Both eyes: 1.0% Mydriacyl, 2.5% Phenylephrine @ 8:22 AM           Slit Lamp and Fundus Exam     Slit Lamp Exam       Right Left   Lids/Lashes Normal Normal   Conjunctiva/Sclera mild melanosis mild melanosis   Cornea mild arcus mild arcus   Anterior Chamber Deep and Clear Deep and Clear   Iris Round and dilated, no NVI Round and dilated, no NVI   Lens Clear Clear   Anterior Vitreous mild syneresis mild syneresis         Fundus Exam       Right Left   Disc Pink and Sharp Pink and Sharp   C/D Ratio 0.2 0.3   Macula Flat, Good foveal reflex, scattered MA, exudates and cystic changes greatest superior macula. Flat, Good foveal reflex, scattered MA, exudates and cystic changes greatest superior macula.   Vessels Attenuated, Tortuous Attenuated, Tortuous   Periphery Attached, scattered MA/DBH greatest posteriorly, focal CWS just outside of IT arcades Attached, scattered MA/DBH greatest posteriorly, focal CWS just outside of IT arcades           IMAGING AND PROCEDURES  Imaging and Procedures for 05/18/2024  OCT, Retina - OU - Both Eyes       Right Eye Quality was good. Central Foveal Thickness: 270. Progression has no prior data. Findings include normal foveal contour, no SRF, intraretinal hyper-reflective material, intraretinal fluid (Scattered IRF/IRHM greatest temporal macula. ).   Left Eye Quality was good.  Central Foveal Thickness: 248. Progression has no prior data. Findings include normal foveal contour, no SRF, intraretinal hyper-reflective material, intraretinal fluid (Scattered IRF/IRH greatest ST macula. ).   Notes *Images captured and stored on drive  Diagnosis / Impression:  +  NPDR w/ DME OU OD: Scattered IRF/IRHM greatest temporal macula.  OS: Scattered IRF/IRH greatest ST macula.    Clinical management:  See below  Abbreviations: NFP - Normal foveal profile. CME - cystoid macular edema. PED - pigment epithelial detachment. IRF - intraretinal fluid. SRF - subretinal fluid. EZ - ellipsoid zone. ERM - epiretinal membrane. ORA - outer retinal atrophy. ORT - outer retinal tubulation. SRHM - subretinal hyper-reflective material. IRHM - intraretinal hyper-reflective material           ASSESSMENT/PLAN:   ICD-10-CM   1. Moderate nonproliferative diabetic retinopathy of both eyes with macular edema associated with type 2 diabetes mellitus (HCC)  E11.3313 OCT, Retina - OU - Both Eyes    2. Essential hypertension  I10     3. Hypertensive retinopathy of both eyes  H35.033      1,2. Moderate Non-proliferative diabetic retinopathy OU - A1C >14 on 06.10.25  - The incidence, risk factors for progression, natural history and treatment options for diabetic retinopathy were discussed with patient.   - The need for close monitoring of blood glucose, blood pressure, and serum lipids, avoiding cigarette or any type of tobacco, and the need for long term follow up was also discussed with patient. - exam shows scattered MA, exudates and cystic changes greatest superior macula. - FA unable to be performed today due to elevated BP. - OCT shows +DME OU - The natural history, pathology, and characteristics of diabetic macular edema discussed with patient.  A generalized discussion of the major clinical trials concerning treatment of diabetic macular edema (ETDRS, DCT, SCORE, RISE / RIDE, and ongoing  DRCR net studies) was completed.  This discussion included mention of the various approaches to treating diabetic macular edema (observation, laser photocoagulation, anti-VEGF injections with lucentis / Avastin / Eylea, steroid injections with Kenalog / Ozurdex , and intraocular surgery with vitrectomy).  The goal hemoglobin A1C of 6-7 was discussed, as well as importance of smoking cessation and hypertension control.  Need for ongoing treatment and monitoring were specifically discussed with reference to chronic nature of diabetic macular edema. - BCVA 20/20 OU - recommend monitoring for now - pt in agreement - RBA of procedure discussed, questions answered - informed consent obtained and signed - see procedure note - f/u in 2-69mo w/ FA transit OS -- DFE/OCT, possible injection   2. Hypertensive retinopathy OU - discussed importance of tight BP control - monitor    Ophthalmic Meds Ordered this visit:  No orders of the defined types were placed in this encounter.    Return for 2-3 months NPDR OU, FA trans OS, DFE, OCT.  There are no Patient Instructions on file for this visit.  Explained the diagnoses, plan, and follow up with the patient and they expressed understanding.  Patient expressed understanding of the importance of proper follow up care.   This document serves as a record of services personally performed by Redell JUDITHANN Hans, MD, PhD. It was created on their behalf by Avelina Pereyra, COA an ophthalmic technician. The creation of this record is the provider's dictation and/or activities during the visit.   Electronically signed by: Avelina GORMAN Pereyra, COT  05/23/24  11:20 AM   This document serves as a record of services personally performed by Redell JUDITHANN Hans, MD, PhD. It was created on their behalf by Almetta Pesa, an ophthalmic technician. The creation of this record is the provider's dictation and/or activities during the visit.    Electronically signed by: Almetta Pesa,  OA,  05/23/24  11:20 AM  Redell JUDITHANN Hans, M.D., Ph.D. Diseases & Surgery of the Retina and Vitreous [facility] 05/18/2024  I have reviewed the above documentation for accuracy and completeness, and I agree with the above. Redell JUDITHANN Hans, M.D., Ph.D. 05/23/24 11:30 AM   Abbreviations: M myopia (nearsighted); A astigmatism; H hyperopia (farsighted); P presbyopia; Mrx spectacle prescription;  CTL contact lenses; OD right eye; OS left eye; OU both eyes  XT exotropia; ET esotropia; PEK punctate epithelial keratitis; PEE punctate epithelial erosions; DES dry eye syndrome; MGD meibomian gland dysfunction; ATs artificial tears; PFAT's preservative free artificial tears; NSC nuclear sclerotic cataract; PSC posterior subcapsular cataract; ERM epi-retinal membrane; PVD posterior vitreous detachment; RD retinal detachment; DM diabetes mellitus; DR diabetic retinopathy; NPDR non-proliferative diabetic retinopathy; PDR proliferative diabetic retinopathy; CSME clinically significant macular edema; DME diabetic macular edema; dbh dot blot hemorrhages; CWS cotton wool spot; POAG primary open angle glaucoma; C/D cup-to-disc ratio; HVF humphrey visual field; GVF goldmann visual field; OCT optical coherence tomography; IOP intraocular pressure; BRVO Branch retinal vein occlusion; CRVO central retinal vein occlusion; CRAO central retinal artery occlusion; BRAO branch retinal artery occlusion; RT retinal tear; SB scleral buckle; PPV pars plana vitrectomy; VH Vitreous hemorrhage; PRP panretinal laser photocoagulation; IVK intravitreal kenalog; VMT vitreomacular traction; MH Macular hole;  NVD neovascularization of the disc; NVE neovascularization elsewhere; AREDS age related eye disease study; ARMD age related macular degeneration; POAG primary open angle glaucoma; EBMD epithelial/anterior basement membrane dystrophy; ACIOL anterior chamber intraocular lens; IOL intraocular lens; PCIOL posterior chamber  intraocular lens; Phaco/IOL phacoemulsification with intraocular lens placement; PRK photorefractive keratectomy; LASIK laser assisted in situ keratomileusis; HTN hypertension; DM diabetes mellitus; COPD chronic obstructive pulmonary disease

## 2024-05-12 DIAGNOSIS — R069 Unspecified abnormalities of breathing: Secondary | ICD-10-CM | POA: Diagnosis not present

## 2024-05-18 ENCOUNTER — Ambulatory Visit (INDEPENDENT_AMBULATORY_CARE_PROVIDER_SITE_OTHER): Admitting: Ophthalmology

## 2024-05-18 ENCOUNTER — Encounter (INDEPENDENT_AMBULATORY_CARE_PROVIDER_SITE_OTHER): Payer: Self-pay | Admitting: Ophthalmology

## 2024-05-18 VITALS — BP 184/125 | HR 92

## 2024-05-18 DIAGNOSIS — H35033 Hypertensive retinopathy, bilateral: Secondary | ICD-10-CM

## 2024-05-18 DIAGNOSIS — I10 Essential (primary) hypertension: Secondary | ICD-10-CM

## 2024-05-18 DIAGNOSIS — H3581 Retinal edema: Secondary | ICD-10-CM

## 2024-05-18 DIAGNOSIS — E113313 Type 2 diabetes mellitus with moderate nonproliferative diabetic retinopathy with macular edema, bilateral: Secondary | ICD-10-CM | POA: Diagnosis not present

## 2024-05-19 ENCOUNTER — Ambulatory Visit: Payer: Self-pay

## 2024-05-26 NOTE — Telephone Encounter (Signed)
 LMTCB. E2C2 please advise when patient calls back.

## 2024-05-26 NOTE — Telephone Encounter (Deleted)
-----   Message from [facility] D REDDY sent at 05/19/2024  2:32 PM EDT ----- Please notify patient that HST revealed severe OSA, recommend proceeding with APAP therapy set to 4-16 cm H2O, EPR 3 with the Airtouch N30i nasal mask. Please also schedule a 3 month follow up visit  if patient wishes to proceed with CPAP therapy. Thanks    ----- Message ----- From: Vannie Donzell RAMAN Sent: 05/13/2024   8:16 AM EDT To: Pallavi D Reddy, MD

## 2024-05-27 NOTE — Addendum Note (Signed)
 Addended by: VICCI EVALENE DEL on: 05/27/2024 01:27 PM   Modules accepted: Orders

## 2024-06-23 ENCOUNTER — Other Ambulatory Visit
Admission: RE | Admit: 2024-06-23 | Discharge: 2024-06-23 | Disposition: A | Discharge status: Home / Self Care | Attending: Urology | Admitting: Urology

## 2024-06-23 DIAGNOSIS — E291 Testicular hypofunction: Secondary | ICD-10-CM | POA: Insufficient documentation

## 2024-06-24 ENCOUNTER — Ambulatory Visit: Payer: Self-pay | Admitting: Urology

## 2024-06-24 DIAGNOSIS — E291 Testicular hypofunction: Secondary | ICD-10-CM

## 2024-06-24 LAB — TESTOSTERONE: Testosterone: 271 ng/dL (ref 264–916)

## 2024-06-24 LAB — PROLACTIN: Prolactin: 8.4 ng/mL (ref 3.9–22.7)

## 2024-06-24 LAB — LUTEINIZING HORMONE: LH: 8.4 m[IU]/mL (ref 1.7–8.6)

## 2024-07-08 NOTE — Progress Notes (Unsigned)
   There were no vitals taken for this visit.   Subjective:    Patient ID: Lee Maldonado, male    DOB: 1981/04/07, 43 y.o.   MRN: [id number]  HPI: Lee Maldonado is a 43 y.o. male with a history of hypertension, hyperlipidemia, type 2 diabetes, and obesity who presents today for a chronic medical management follow-up.            03/10/2024    9:09 AM 09/15/2020    5:04 PM 08/10/2020   10:59 AM  Depression screen PHQ 2/9  Decreased Interest 0 0 0  Down, Depressed, Hopeless 0 0 0  PHQ - 2 Score 0 0 0  Altered sleeping 0 2 0  Tired, decreased energy 0 1 0  Change in appetite 0 1 0  Feeling bad or failure about yourself  0 0 0  Trouble concentrating 0 0 0  Moving slowly or fidgety/restless 0 0 0  Suicidal thoughts 0 0 0  PHQ-9 Score 0 4 0  Difficult doing work/chores Not difficult at all Not difficult at all Not difficult at all    Relevant past medical, surgical, family and social history reviewed and updated as indicated. Interim medical history since our last visit reviewed. Allergies and medications reviewed and updated.  Review of Systems  Per HPI unless specifically indicated above     Objective:     There were no vitals taken for this visit.  {Vitals History (Optional):23777} Wt Readings from Last 3 Encounters:  04/08/24 236 lb (107 kg)  04/02/24 236 lb 9.6 oz (107.3 kg)  03/10/24 235 lb 4.8 oz (106.7 kg)    Physical Exam   Results for orders placed or performed during the hospital encounter of 06/23/24  Luteinizing hormone   Collection Time: 06/23/24 10:06 AM  Result Value Ref Range   LH 8.4 1.7 - 8.6 mIU/mL  Testosterone    Collection Time: 06/23/24 10:06 AM  Result Value Ref Range   Testosterone  271 264 - 916 ng/dL  Prolactin   Collection Time: 06/23/24 10:06 AM  Result Value Ref Range   Prolactin 8.4 3.9 - 22.7 ng/mL   {Labs (Optional):23779}       Assessment & Plan:   Problem List Items Addressed This Visit   None    Assessment and  Plan         Follow up plan: No follow-ups on file.

## 2024-07-10 ENCOUNTER — Other Ambulatory Visit: Payer: Self-pay

## 2024-07-10 ENCOUNTER — Ambulatory Visit: Admitting: Nurse Practitioner

## 2024-07-10 ENCOUNTER — Encounter: Payer: Self-pay | Admitting: Nurse Practitioner

## 2024-07-10 VITALS — BP 128/82 | HR 97 | Temp 98.0°F | Resp 16 | Ht 68.5 in | Wt 241.8 lb

## 2024-07-10 DIAGNOSIS — Z23 Encounter for immunization: Secondary | ICD-10-CM

## 2024-07-10 DIAGNOSIS — I10 Essential (primary) hypertension: Secondary | ICD-10-CM | POA: Diagnosis not present

## 2024-07-10 DIAGNOSIS — E1165 Type 2 diabetes mellitus with hyperglycemia: Secondary | ICD-10-CM | POA: Diagnosis not present

## 2024-07-10 DIAGNOSIS — G4733 Obstructive sleep apnea (adult) (pediatric): Secondary | ICD-10-CM

## 2024-07-10 DIAGNOSIS — Z794 Long term (current) use of insulin: Secondary | ICD-10-CM

## 2024-07-10 DIAGNOSIS — E782 Mixed hyperlipidemia: Secondary | ICD-10-CM | POA: Diagnosis not present

## 2024-07-10 DIAGNOSIS — R7989 Other specified abnormal findings of blood chemistry: Secondary | ICD-10-CM

## 2024-07-10 LAB — POCT GLYCOSYLATED HEMOGLOBIN (HGB A1C): Hemoglobin A1C: 12.5 % — AB (ref 4.0–5.6)

## 2024-07-10 MED ORDER — TELMISARTAN-AMLODIPINE 40-5 MG PO TABS: 1.0000 | ORAL_TABLET | Freq: Every day | ORAL | 1 refills | Status: AC

## 2024-07-11 ENCOUNTER — Encounter (INDEPENDENT_AMBULATORY_CARE_PROVIDER_SITE_OTHER): Payer: Self-pay

## 2024-08-04 NOTE — Progress Notes (Shared)
 [facility] - Clinic Note  08/18/2024   CHIEF COMPLAINT Patient presents for No chief complaint on file.  HISTORY OF PRESENT ILLNESS: Lee Maldonado is a 43 y.o. male who presents to the clinic today for:   A1c >14 on 06.10.25   Referring physician: Gareth Mliss FALCON, FNP [address] [place],  [place] [address]  HISTORICAL INFORMATION:  Selected notes from the MEDICAL RECORD NUMBER Referred by Dr. Gust LEE:  Ocular Hx- PMH-   CURRENT MEDICATIONS: No current outpatient medications on file. (Ophthalmic Drugs)   No current facility-administered medications for this visit. (Ophthalmic Drugs)   Current Outpatient Medications (Other)  Medication Sig   blood glucose meter kit and supplies Dispense based on patient and insurance preference. Use up to four times daily as directed. (FOR ICD-10 E10.9, E11.9). Check blood sugar daily fasting in am and before each meal, QID.   insulin  degludec (TRESIBA  FLEXTOUCH) 200 UNIT/ML FlexTouch Pen Inject 30 Units into the skin daily.   insulin  lispro (HUMALOG  KWIKPEN) 200 UNIT/ML KwikPen Max daily 75 units   Insulin  Pen Needle 29G X MISC 1 Device by Does not apply route in the morning, at noon, in the evening, and at bedtime.   Lancets (ONETOUCH DELICA PLUS LANCET33G) MISC Apply topically 4 (four) times daily.   metFORMIN  (GLUCOPHAGE ) 1000 MG tablet Take 1,000 mg by mouth 2 (two) times daily with a meal.   ONETOUCH VERIO test strip 4 (four) times daily.   pantoprazole  (PROTONIX ) 40 MG tablet Take 1 tablet (40 mg total) by mouth daily. (Patient not taking: Reported on 07/10/2024)   tadalafil  (CIALIS ) 5 MG tablet Take 1-4 tablets (5-20 mg total) by mouth daily as needed for erectile dysfunction (take 1 hour prior to sexual activity).   Telmisartan-amLODIPine 40-5 MG TABS Take 1 tablet by mouth daily.   No current facility-administered medications for this visit. (Other)   REVIEW OF  SYSTEMS:   ALLERGIES Allergies  Allergen Reactions   Lantus  [Insulin  Glargine] Swelling   PAST MEDICAL HISTORY Past Medical History:  Diagnosis Date   Allergy    pollen   Diabetes mellitus    GERD (gastroesophageal reflux disease)    with certain foods   Hypercholesteremia    Hypertension    Pancreatitis    Sleep apnea    need c-pap machine   No past surgical history on file. FAMILY HISTORY Family History  Problem Relation Age of Onset   Diabetes Mother    Diabetes Father    SOCIAL HISTORY Social History   Tobacco Use   Smoking status: Every Day    Current packs/day: 0.25    Types: Cigarettes    Passive exposure: Yes   Smokeless tobacco: Never   Tobacco comments:    Smokes 2 cigarettes a day- khj 04/02/2024        Started smoking at 43 years old.    Smoked 1 PPD at his heaviest  Vaping Use   Vaping status: Never Used  Substance Use Topics   Alcohol use: Yes    Alcohol/week: 6.0 standard drinks of alcohol    Types: 2 Cans of beer, 2 Shots of liquor, 2 Standard drinks or equivalent per week   Drug use: No       OPHTHALMIC EXAM:  Not recorded    IMAGING AND PROCEDURES  Imaging and Procedures for 08/18/2024         ASSESSMENT/PLAN: No diagnosis found.  1,2. Moderate Non-proliferative diabetic retinopathy OU -  A1C >14 on 06.10.25  - The incidence, risk factors for progression, natural history and treatment options for diabetic retinopathy were discussed with patient.   - The need for close monitoring of blood glucose, blood pressure, and serum lipids, avoiding cigarette or any type of tobacco, and the need for long term follow up was also discussed with patient. - exam shows scattered MA, exudates and cystic changes greatest superior macula. - FA unable to be performed today due to elevated BP. - OCT shows +DME OU - The natural history, pathology, and characteristics of diabetic macular edema discussed with patient.  A generalized discussion of the  major clinical trials concerning treatment of diabetic macular edema (ETDRS, DCT, SCORE, RISE / RIDE, and ongoing DRCR net studies) was completed.  This discussion included mention of the various approaches to treating diabetic macular edema (observation, laser photocoagulation, anti-VEGF injections with lucentis / Avastin / Eylea, steroid injections with Kenalog / Ozurdex , and intraocular surgery with vitrectomy).  The goal hemoglobin A1C of 6-7 was discussed, as well as importance of smoking cessation and hypertension control.  Need for ongoing treatment and monitoring were specifically discussed with reference to chronic nature of diabetic macular edema. - BCVA 20/20 OU - recommend monitoring for now - pt in agreement - RBA of procedure discussed, questions answered - informed consent obtained and signed - see procedure note - f/u in 2-40mo w/ FA transit OS -- DFE/OCT, possible injection   2. Hypertensive retinopathy OU - discussed importance of tight BP control - monitor    Ophthalmic Meds Ordered this visit:  No orders of the defined types were placed in this encounter.    No follow-ups on file.  There are no Patient Instructions on file for this visit.  Explained the diagnoses, plan, and follow up with the patient and they expressed understanding.  Patient expressed understanding of the importance of proper follow up care.   This document serves as a record of services personally performed by Redell JUDITHANN Hans, MD, PhD. It was created on their behalf by Wanda GEANNIE Keens, COT an ophthalmic technician. The creation of this record is the provider's dictation and/or activities during the visit.    Electronically signed by:  Wanda GEANNIE Keens, COT  08/04/24 12:27 PM   Redell JUDITHANN Hans, M.D., Ph.D. Diseases & Surgery of the Retina and Vitreous [facility] 08/18/2024    Abbreviations: M myopia (nearsighted); A astigmatism; H hyperopia (farsighted); P  presbyopia; Mrx spectacle prescription;  CTL contact lenses; OD right eye; OS left eye; OU both eyes  XT exotropia; ET esotropia; PEK punctate epithelial keratitis; PEE punctate epithelial erosions; DES dry eye syndrome; MGD meibomian gland dysfunction; ATs artificial tears; PFAT's preservative free artificial tears; NSC nuclear sclerotic cataract; PSC posterior subcapsular cataract; ERM epi-retinal membrane; PVD posterior vitreous detachment; RD retinal detachment; DM diabetes mellitus; DR diabetic retinopathy; NPDR non-proliferative diabetic retinopathy; PDR proliferative diabetic retinopathy; CSME clinically significant macular edema; DME diabetic macular edema; dbh dot blot hemorrhages; CWS cotton wool spot; POAG primary open angle glaucoma; C/D cup-to-disc ratio; HVF humphrey visual field; GVF goldmann visual field; OCT optical coherence tomography; IOP intraocular pressure; BRVO Branch retinal vein occlusion; CRVO central retinal vein occlusion; CRAO central retinal artery occlusion; BRAO branch retinal artery occlusion; RT retinal tear; SB scleral buckle; PPV pars plana vitrectomy; VH Vitreous hemorrhage; PRP panretinal laser photocoagulation; IVK intravitreal kenalog; VMT vitreomacular traction; MH Macular hole;  NVD neovascularization of the disc; NVE neovascularization elsewhere; AREDS age related  eye disease study; ARMD age related macular degeneration; POAG primary open angle glaucoma; EBMD epithelial/anterior basement membrane dystrophy; ACIOL anterior chamber intraocular lens; IOL intraocular lens; PCIOL posterior chamber intraocular lens; Phaco/IOL phacoemulsification with intraocular lens placement; PRK photorefractive keratectomy; LASIK laser assisted in situ keratomileusis; HTN hypertension; DM diabetes mellitus; COPD chronic obstructive pulmonary disease

## 2024-08-18 ENCOUNTER — Encounter (INDEPENDENT_AMBULATORY_CARE_PROVIDER_SITE_OTHER): Admitting: Ophthalmology

## 2024-08-18 DIAGNOSIS — I10 Essential (primary) hypertension: Secondary | ICD-10-CM

## 2024-08-18 DIAGNOSIS — E113313 Type 2 diabetes mellitus with moderate nonproliferative diabetic retinopathy with macular edema, bilateral: Secondary | ICD-10-CM

## 2024-08-18 DIAGNOSIS — H35033 Hypertensive retinopathy, bilateral: Secondary | ICD-10-CM

## 2024-08-24 ENCOUNTER — Telehealth: Payer: Self-pay | Admitting: Urology

## 2024-08-24 DIAGNOSIS — E291 Testicular hypofunction: Secondary | ICD-10-CM

## 2024-08-24 MED ORDER — CLOMIPHENE CITRATE 50 MG PO TABS: 25.0000 mg | ORAL_TABLET | Freq: Every day | ORAL | 5 refills | Status: AC

## 2024-08-24 NOTE — Telephone Encounter (Signed)
 Pt left message with after hours and would like his Clomid  25 mg sent to pharmacy.  He is not currently having any symptoms.

## 2024-08-24 NOTE — Telephone Encounter (Signed)
 Mychart message was sent to patient for him to confirm which pharmacy he wanted RX sent to, will send RX to local pharmacy. Ok to send to [facility] should pt change his mind.

## 2024-09-04 ENCOUNTER — Other Ambulatory Visit

## 2024-09-09 ENCOUNTER — Ambulatory Visit: Admitting: Urology
